# Patient Record
Sex: Female | Born: 1950 | Race: White | Hispanic: No | Marital: Married | Smoking: Never smoker
Health system: Southern US, Community
[De-identification: ages and names within clinical notes are randomized; demographics above are authoritative.]

## PROBLEM LIST (undated history)

## (undated) DIAGNOSIS — K589 Irritable bowel syndrome without diarrhea: Secondary | ICD-10-CM

## (undated) DIAGNOSIS — M1990 Unspecified osteoarthritis, unspecified site: Secondary | ICD-10-CM

## (undated) DIAGNOSIS — Z87440 Personal history of urinary (tract) infections: Secondary | ICD-10-CM

## (undated) DIAGNOSIS — E785 Hyperlipidemia, unspecified: Secondary | ICD-10-CM

## (undated) DIAGNOSIS — I10 Essential (primary) hypertension: Secondary | ICD-10-CM

## (undated) DIAGNOSIS — H269 Unspecified cataract: Secondary | ICD-10-CM

## (undated) DIAGNOSIS — K449 Diaphragmatic hernia without obstruction or gangrene: Secondary | ICD-10-CM

## (undated) DIAGNOSIS — R32 Unspecified urinary incontinence: Secondary | ICD-10-CM

## (undated) DIAGNOSIS — T7840XA Allergy, unspecified, initial encounter: Secondary | ICD-10-CM

## (undated) DIAGNOSIS — E039 Hypothyroidism, unspecified: Secondary | ICD-10-CM

## (undated) DIAGNOSIS — K219 Gastro-esophageal reflux disease without esophagitis: Secondary | ICD-10-CM

## (undated) HISTORY — DX: Diaphragmatic hernia without obstruction or gangrene: K44.9

## (undated) HISTORY — DX: Unspecified urinary incontinence: R32

## (undated) HISTORY — DX: Unspecified cataract: H26.9

## (undated) HISTORY — PX: OTHER SURGICAL HISTORY: SHX169

## (undated) HISTORY — PX: POLYPECTOMY: SHX149

## (undated) HISTORY — DX: Allergy, unspecified, initial encounter: T78.40XA

## (undated) HISTORY — PX: COLONOSCOPY: SHX174

## (undated) HISTORY — DX: Personal history of urinary (tract) infections: Z87.440

## (undated) HISTORY — DX: Irritable bowel syndrome, unspecified: K58.9

## (undated) HISTORY — DX: Unspecified osteoarthritis, unspecified site: M19.90

## (undated) HISTORY — PX: BREAST EXCISIONAL BIOPSY: SUR124

## (undated) HISTORY — DX: Essential (primary) hypertension: I10

## (undated) HISTORY — DX: Gastro-esophageal reflux disease without esophagitis: K21.9

## (undated) HISTORY — PX: WISDOM TOOTH EXTRACTION: SHX21

## (undated) HISTORY — DX: Hyperlipidemia, unspecified: E78.5

## (undated) HISTORY — PX: TONSILLECTOMY AND ADENOIDECTOMY: SHX28

## (undated) HISTORY — DX: Hypothyroidism, unspecified: E03.9

---

## 1978-05-04 HISTORY — PX: TUBAL LIGATION: SHX77

## 1988-05-04 HISTORY — PX: BREAST BIOPSY: SHX20

## 1999-03-04 ENCOUNTER — Encounter: Admission: RE | Admit: 1999-03-04 | Discharge: 1999-03-04 | Payer: Self-pay | Admitting: *Deleted

## 1999-03-04 ENCOUNTER — Encounter: Payer: Self-pay | Admitting: *Deleted

## 2001-04-06 ENCOUNTER — Other Ambulatory Visit: Admission: RE | Admit: 2001-04-06 | Discharge: 2001-04-06 | Payer: Self-pay | Admitting: Obstetrics and Gynecology

## 2001-05-04 HISTORY — PX: ABDOMINAL HYSTERECTOMY: SHX81

## 2001-05-27 ENCOUNTER — Inpatient Hospital Stay (HOSPITAL_COMMUNITY): Admission: RE | Admit: 2001-05-27 | Discharge: 2001-05-29 | Payer: Self-pay | Admitting: Obstetrics and Gynecology

## 2001-05-27 ENCOUNTER — Encounter (INDEPENDENT_AMBULATORY_CARE_PROVIDER_SITE_OTHER): Payer: Self-pay | Admitting: *Deleted

## 2002-01-23 ENCOUNTER — Encounter: Admission: RE | Admit: 2002-01-23 | Discharge: 2002-01-23 | Payer: Self-pay

## 2002-02-06 ENCOUNTER — Encounter: Admission: RE | Admit: 2002-02-06 | Discharge: 2002-02-06 | Payer: Self-pay

## 2004-01-04 ENCOUNTER — Encounter: Admission: RE | Admit: 2004-01-04 | Discharge: 2004-01-04 | Payer: Self-pay | Admitting: Family Medicine

## 2004-09-07 ENCOUNTER — Emergency Department (HOSPITAL_COMMUNITY): Admission: EM | Admit: 2004-09-07 | Discharge: 2004-09-07 | Payer: Self-pay | Admitting: Emergency Medicine

## 2004-09-09 ENCOUNTER — Ambulatory Visit: Payer: Self-pay | Admitting: Gastroenterology

## 2004-10-14 ENCOUNTER — Ambulatory Visit: Payer: Self-pay | Admitting: Gastroenterology

## 2005-02-23 ENCOUNTER — Encounter: Admission: RE | Admit: 2005-02-23 | Discharge: 2005-02-23 | Payer: Self-pay | Admitting: Obstetrics and Gynecology

## 2005-06-03 ENCOUNTER — Encounter: Admission: RE | Admit: 2005-06-03 | Discharge: 2005-06-03 | Payer: Self-pay | Admitting: Internal Medicine

## 2007-07-26 ENCOUNTER — Encounter: Admission: RE | Admit: 2007-07-26 | Discharge: 2007-07-26 | Payer: Self-pay | Admitting: Internal Medicine

## 2008-09-07 ENCOUNTER — Encounter: Admission: RE | Admit: 2008-09-07 | Discharge: 2008-09-07 | Payer: Self-pay | Admitting: Occupational Medicine

## 2010-02-13 ENCOUNTER — Encounter: Admission: RE | Admit: 2010-02-13 | Discharge: 2010-02-13 | Payer: Self-pay | Admitting: Internal Medicine

## 2010-05-04 HISTORY — PX: OTHER SURGICAL HISTORY: SHX169

## 2010-09-19 NOTE — Op Note (Signed)
[facility]  Patient:    Robin Mcdonald, Robin Mcdonald Visit Number: [id number] MRN: [id number]          Service Type: GYN Location: 4W 0456 01 Attending Physician:  Michaele Offer Dictated by:   Zenaida Niece, M.D. Proc. Date: 05/27/01 Admit Date:  05/27/2001                             Operative Report  PREOPERATIVE DIAGNOSIS:  Symptomatic leiomyomatous uterus.  POSTOPERATIVE DIAGNOSIS:  Symptomatic leiomyomatous uterus.  PROCEDURE:  Total abdominal hysterectomy with bilateral salpingo-oophorectomy.  SURGEON:  Zenaida Niece, M.D.  ASSISTANT:  Alvino Chapel, M.D.  ANESTHESIA:  Epidural.  ESTIMATED BLOOD LOSS:  100 cc.  FINDINGS:  Enlarged irregular uterus with multiple leiomyomas, normal tubes and ovaries.  DESCRIPTION OF PROCEDURE:  The patient was taken to the operating room after having her epidural placed in the PACU. She was placed in the dorsal supine position and her epidural was dosed appropriately. Her abdomen and vagina were prepped and draped in the usual sterile fashion and a Foley catheter inserted. The level of her anesthesia was found to be adequate and her abdomen was entered via a standard Pfannenstiel incision. A self retaining retractor was placed and the bowels were packed out of the pelvis. The uterus was inspected and found to be enlarged with multiple small leiomyomas. Both uterine cornu were grasped with Kelly clamps. The round ligaments were divided with electrocautery and the broad ligaments divided laterally and anteriorly to come across the anterior portion of the uterus and create the bladder flap. Both infundibulopelvic ligaments were isolated, clamped, transected, and doubly ligated with #1 chromic. The uterine arteries were then skeletonized after the bladder was pushed inferiorly. The uterine arteries were clamped, transected and ligated with #1 chromic. The cardinal ligaments and  uterosacral ligaments were clamped, transected, and ligated with #1 chromic being sure to push the bladder inferiorly out of the way. Right angled Zeppelins were then used to come across both vaginal angles and the uterus and cervix with the ovaries and tubes attached were removed sharply. The vaginal angles were sutured with #1 chromic and tagged for later use. The remainder of the vagina was closed with one figure-of-eight suture of #1 chromic. The pelvis was irrigated and bleeding from serosal edges was controlled with electrocautery. The bladder did appear to be fairly close to the vaginal incision. The bladder was then filled with sterile milk and on palpation and inspection there was no milk spillage and the bladder did appear to be well away from any suture lines. The pelvis was again irrigated and found to be hemostatic. The uterosacral ligaments were plicated in the midline with #0 silk. The pelvis was again inspected and found to be hemostatic. All packs were removed and a self retaining retractor was removed. The ureters had been identified on both sides and were found to be well away from any suture lines. The subfascial space was irrigated and made hemostatic with electrocautery. The fascia was closed in a running fashion starting at both ends and meeting in the middle with #0 Vicryl. The subcutaneous tissue was irrigated and made hemostatic with electrocautery. It was then closed with running 2-0 plain gut suture. The skin was then closed with staples and a sterile dressing. The patient tolerated the procedure well and was taken to the operating room in stable condition. Dictated by:   Zenaida Niece, M.D. Attending  Physician:  Michaele Offer DD:  05/27/01 TD:  05/28/01 Job: [phone] [id number]

## 2010-09-19 NOTE — H&P (Signed)
[facility]  Patient:    Robin Mcdonald, Robin Mcdonald Visit Number: [id number] MRN: [id number]          Service Type: Attending:  Zenaida Niece, M.D. Dictated by:   Zenaida Niece, M.D. Adm. Date:  05/27/01                           History and Physical  CHIEF COMPLAINT:  Leiomyomatas uterus.  HISTORY OF PRESENT ILLNESS:  This is a 60 year old white female G2, P1-0-1-1 who was seen for an annual exam on April 06, 2001.  At that time she complained of having menses every month with two months of spotting prior to menses.  Her menses were heavy off and on with significant clotting and significant cramping.  She was also experiencing urinary frequency and leakage with cough and sneeze.  Exam revealed 10 to 12-week-size irregular uterus consistent with leiomyomatas.  This was significantly enlarged from the exam a year prior and the patient was advised to have hysterectomy.  She is admitted for this at this time.  PAST OB HISTORY:  In 1971 elected termination, in 1975 vaginal delivery at term 7 pounds 14 ounces and she had post partum febrile morbidity.  PAST MEDICAL HISTORY:  Hypertension, irritable bowel, gastroesophageal reflux, and asthma.  PAST SURGICAL HISTORY:  Tubal ligation, tonsillectomy, wisdom teeth removal, left ear stapedectomy, left breast biopsy, right ear stapedectomy, ganglion cyst removal.  CURRENT MEDICATIONS:  Hyzaar, Nexium, Advair, Robinul Forte, and albuterol p.r.n.  SOCIAL HISTORY:  The patient is married and denies alcohol, tobacco or drug use.  REVIEW OF SYSTEMS:  Other than urine leakage she has a normal urinary function and normal bowel function and no other significant complaints.  FAMILY HISTORY:  No GYN or colon cancer.  PHYSICAL EXAMINATION:  GENERAL:  Weight is 150 pounds.  She is a well-developed, well-nourished white female who is in no acute distress.  VITAL SIGNS:  Blood pressure 142/80, pulse 100, office  hemoglobin 15.4.  HEENT:  Pupils are equally round and reactive to light and accommodation. Extraocular muscles are intact.  Oropharynx is clear without erythema or exudates.  NECK:  Supple without lymphadenopathy or thyromegaly.  LUNGS:  Clear to auscultation.  HEART:  Regular rate and rhythm without murmur.  BREASTS:  Exam in the sitting and supine position revealed no dominant masses, adenopathy, skin change, or nipple discharge and she has a scar on her left breast from prior biopsy.  ABDOMEN:  Soft, nontender, nondistended with palpable uterine fundus or myoma just above the pubic symphysis.  PELVIC EXAM :  Reveals normal external genitalia.  On speculum exam she has a normal cervix and Pap smear has returned within normal limits.  Bimanual exam reveals a 10 to 12-week-size irregular uterus with palpable myomas.  She does a good Kegel exercise and has no appreciable prolapse.  Recto-vaginal exam confirms this.  ASSESSMENT:  Symptomatic leiomyomatas uterus.  The patient has been given options of expectant management, treatment with Lupron and surgical therapy. I have had an extensive discussion with the patient and her husband and they agree to proceed with hysterectomy.  Risks for surgery including bleeding, infection, damage to surrounding organs have been discussed and the patient agrees to proceed.  Preop lab work has revealed hypokalemia which is being replaced orally.  Her other multiple medical problems appear to be in good control.  PLAN:  The plan is to admit the patient for total abdominal hysterectomy with  bilateral salpingo-oophorectomy. Dictated by:   Zenaida Niece, M.D. Attending:  Zenaida Niece, M.D. DD:  05/26/01 TD:  05/26/01 Job: [id number]

## 2010-09-19 NOTE — Discharge Summary (Signed)
[facility]  Patient:    Robin Mcdonald, Robin Mcdonald Visit Number: [id number] MRN: [id number]          Service Type: GYN Location: 4W 0456 01 Attending Physician:  Michaele Offer Dictated by:   Zenaida Niece, M.D. Admit Date:  05/27/2001 Discharge Date: 05/29/2001                             Discharge Summary  ADMISSION DIAGNOSIS:  Symptomatic leiomyomatous uterus.  DISCHARGE DIAGNOSIS:  Symptomatic leiomyomatous uterus.  PROCEDURE:  Total abdominal hysterectomy, bilateral salpingo-oophorectomy.  COMPLICATIONS:  None.  CONSULTATIONS:  None.  HISTORY OF PRESENT ILLNESS:  For full history and physical, please see the chart.  Briefly, this is a 60 year old white female, gravida 2, para 1-0-1-1, with an enlarging leiomyomatous uterus who has heavy menses with significant clotting and significant cramping, and is experiencing some pressure on her bladder from the fibroids, and is admitted for surgical removal.  PHYSICAL EXAMINATION:  ABDOMEN:  Benign with the uterine fundus palpable just above the pubic synthesis.  PELVIC:  Uterus is 10 to 12 weeks size, irregular, with palpable myomas, and she has no appreciable prolapse, and no adnexal masses.  HOSPITAL COURSE:  The patient was admitted on the day of surgery and underwent a total abdominal hysterectomy and bilateral salpingo-oophorectomy under epidural anesthesia.  Estimated blood loss was 100 cc.  She had an enlarged irregular uterus with multiple leiomyomas and normal tubes and ovaries.  Postoperatively, she was continued on her epidural and did have some leg numbness due to continued use of local anesthetic.  Once this was discontinued and narcotic only was continued, the leg numbness went away and she felt much better.  She did have a temperature to 100.8, on postoperative day #0, but that was her only temperature, and she was otherwise afebrile.  Preoperative hemoglobin was 13.6,  postoperative was 12.2. Preoperative potassium was 3.2, up from 3.1 a week preoperatively with potassium supplementation.  She was continued on all of her preadmission medications for her other medical problems, which include hypertension, irritable bowel, gastroesophageal reflux, and asthma.  On the morning of postoperative day #2, she again had remained afebrile, she was tolerating a regular diet without nausea and vomiting, and was passing flatus.  Her incision was well approximated, and she was felt to be stable enough for discharge home.  CONDITION ON DISCHARGE:  Stable.  DISPOSITION:  Discharged to home.  DIET:  Regular.  ACTIVITY:  Pelvic rest.  No strenuous activity, no driving.  FOLLOWUP:  In three days for staple removal.  DISCHARGE MEDICATIONS: 1. She is to continue all of her preadmission medications including potassium    20 mEq b.i.d. 2. Percocet one or two p.o. q.4-6h. p.r.n. pain #30. 3. Estradiol 1 mg p.o. q.d. #60.Dictated by:   Zenaida Niece, M.D.  Attending Physician:  Michaele Offer DD:  05/29/01 TD:  05/30/01 Job: [id number]

## 2011-01-26 ENCOUNTER — Encounter: Payer: Self-pay | Admitting: Gastroenterology

## 2011-02-02 ENCOUNTER — Ambulatory Visit (INDEPENDENT_AMBULATORY_CARE_PROVIDER_SITE_OTHER): Payer: BC Managed Care – PPO | Admitting: Gastroenterology

## 2011-02-02 ENCOUNTER — Other Ambulatory Visit (INDEPENDENT_AMBULATORY_CARE_PROVIDER_SITE_OTHER): Payer: BC Managed Care – PPO

## 2011-02-02 ENCOUNTER — Encounter: Payer: Self-pay | Admitting: Gastroenterology

## 2011-02-02 VITALS — BP 124/78 | HR 80 | Ht 64.5 in | Wt 145.0 lb

## 2011-02-02 DIAGNOSIS — K219 Gastro-esophageal reflux disease without esophagitis: Secondary | ICD-10-CM

## 2011-02-02 DIAGNOSIS — R1013 Epigastric pain: Secondary | ICD-10-CM

## 2011-02-02 DIAGNOSIS — K589 Irritable bowel syndrome without diarrhea: Secondary | ICD-10-CM

## 2011-02-02 LAB — BASIC METABOLIC PANEL
BUN: 21 mg/dL (ref 6–23)
CO2: 31 mEq/L (ref 19–32)
Calcium: 9.5 mg/dL (ref 8.4–10.5)
Chloride: 100 mEq/L (ref 96–112)
Creatinine, Ser: 0.9 mg/dL (ref 0.4–1.2)
GFR: 69.87 mL/min (ref >60.00)
Glucose, Bld: 99 mg/dL (ref 70–99)
Potassium: 3.5 mEq/L (ref 3.5–5.1)
Sodium: 141 mEq/L (ref 135–145)

## 2011-02-02 LAB — HEPATIC FUNCTION PANEL
ALT: 21 U/L (ref 0–35)
AST: 25 U/L (ref 0–37)
Albumin: 4.9 g/dL (ref 3.5–5.2)
Alkaline Phosphatase: 55 U/L (ref 39–117)
Bilirubin, Direct: 0.1 mg/dL (ref 0.0–0.3)
Total Bilirubin: 0.5 mg/dL (ref 0.3–1.2)
Total Protein: 7.9 g/dL (ref 6.0–8.3)

## 2011-02-02 LAB — TSH: TSH: 0.88 u[IU]/mL (ref 0.35–5.50)

## 2011-02-02 LAB — CBC WITH DIFFERENTIAL/PLATELET
Basophils Absolute: 0 10*3/uL (ref 0.0–0.1)
Eosinophils Absolute: 0.1 10*3/uL (ref 0.0–0.7)
Hemoglobin: 14.4 g/dL (ref 12.0–15.0)
Monocytes Relative: 6.4 % (ref 3.0–12.0)
RBC: 4.9 Mil/uL (ref 3.87–5.11)
RDW: 13.3 % (ref 11.5–14.6)

## 2011-02-02 LAB — AMYLASE: Amylase: 40 U/L (ref 27–131)

## 2011-02-02 LAB — LIPASE: Lipase: 27 U/L (ref 11.0–59.0)

## 2011-02-02 NOTE — Patient Instructions (Signed)
Go directly to the basement to have your labs today.  You have been scheduled for a Upper Endoscopy. See separate instructions.  Your Abdominal Ultrasound has been scheduled for 02/05/11 at 8:00am at [facility]. Please arrive at 7:45 for registration. Nothing to eat or drink after midnight. cc: Georgann Housekeeper, MD

## 2011-02-02 NOTE — Progress Notes (Signed)
History of Present Illness: This is a 60 year old female seen in the past. She has a history of GERD and irritable bowel syndrome with alternating diarrhea and constipation. She underwent upper endoscopy in September 2002 showing a small hiatal hernia. She underwent colonoscopy in June 2006 for hematochezia and Hemoccult-positive stool showing only small internal. She has been maintained on esomeprazole and glycopyrrolate for years to manage GERD and IBS. She states over the past 10 months she has had recurrent episodes of epigastric pain, generally occurring between 2 and 3 in the morning, and lasting for about 1-2 hours. It generally occurs once or twice each month. She describes a dull, aching pain associated with nausea. She does not related her symptoms to any particular foods or activities. On one occasion this occurred in the afternoon. Denies weight loss, change in stool caliber, melena, hematochezia, nausea, vomiting, dysphagia, reflux symptoms, chest pain.  Review of Systems: Pertinent positive and negative review of systems were noted in the above HPI section. All other review of systems were otherwise negative.  Current Medications, Allergies, Past Medical History, Past Surgical History, Family History and Social History were reviewed in [employer] record.  Physical Exam: General: Well developed , well nourished, no acute distress Head: Normocephalic and atraumatic Eyes:  sclerae anicteric, EOMI Ears: Normal auditory acuity Mouth: No deformity or lesions Neck: Supple, no masses or thyromegaly Lungs: Clear throughout to auscultation Heart: Regular rate and rhythm; no murmurs, rubs or bruits Abdomen: Soft, non tender and non distended. No masses, hepatosplenomegaly or hernias noted. Normal Bowel sounds Musculoskeletal: Symmetrical with no gross deformities  Skin: No lesions on visible extremities Pulses:  Normal pulses noted Extremities: No clubbing, cyanosis,  edema or deformities noted Neurological: Alert oriented x 4, grossly nonfocal Cervical Nodes:  No significant cervical adenopathy Inguinal Nodes: No significant inguinal adenopathy Psychological:  Alert and cooperative. Normal mood and affect  Assessment and Recommendations:  1. Recurrent epigastric pain generally occurring at night. Rule out GERD, ulcer disease and cholelithiasis. Obtain blood work. Schedule ultrasound. Schedule upper endoscopy. The risks, benefits, and alternatives to endoscopy with possible biopsy and possible dilation were discussed with the patient and they consent to proceed.   2. Colorectal cancer screening. Average risk. Colonoscopy June 2016.

## 2011-02-03 ENCOUNTER — Encounter: Payer: Self-pay | Admitting: Gastroenterology

## 2011-02-05 ENCOUNTER — Ambulatory Visit (HOSPITAL_COMMUNITY)
Admission: RE | Admit: 2011-02-05 | Discharge: 2011-02-05 | Disposition: A | Discharge status: Home / Self Care | Payer: BC Managed Care – PPO | Source: Ambulatory Visit | Attending: Gastroenterology | Admitting: Gastroenterology

## 2011-02-05 DIAGNOSIS — R1013 Epigastric pain: Secondary | ICD-10-CM | POA: Insufficient documentation

## 2011-02-05 DIAGNOSIS — K8020 Calculus of gallbladder without cholecystitis without obstruction: Secondary | ICD-10-CM | POA: Insufficient documentation

## 2011-02-11 ENCOUNTER — Other Ambulatory Visit: Payer: BC Managed Care – PPO | Admitting: Gastroenterology

## 2011-03-05 ENCOUNTER — Encounter: Payer: Self-pay | Admitting: Gastroenterology

## 2011-03-05 ENCOUNTER — Ambulatory Visit (AMBULATORY_SURGERY_CENTER): Payer: BC Managed Care – PPO | Admitting: Gastroenterology

## 2011-03-05 DIAGNOSIS — K219 Gastro-esophageal reflux disease without esophagitis: Secondary | ICD-10-CM

## 2011-03-05 DIAGNOSIS — R1013 Epigastric pain: Secondary | ICD-10-CM

## 2011-03-05 DIAGNOSIS — K589 Irritable bowel syndrome without diarrhea: Secondary | ICD-10-CM

## 2011-03-05 MED ORDER — SODIUM CHLORIDE 0.9 % IV SOLN: 500.0000 mL | INTRAVENOUS | Status: DC

## 2011-03-05 NOTE — Patient Instructions (Signed)
Please refer to blue and green discharge instruction sheets. 

## 2011-03-06 ENCOUNTER — Telehealth: Payer: Self-pay | Admitting: *Deleted

## 2011-03-06 NOTE — Telephone Encounter (Signed)
Called and got voice mail that identifies pt by first and last name so left message for pt. ewm

## 2011-09-01 ENCOUNTER — Other Ambulatory Visit: Payer: Self-pay | Admitting: Internal Medicine

## 2011-09-01 DIAGNOSIS — Z1231 Encounter for screening mammogram for malignant neoplasm of breast: Secondary | ICD-10-CM

## 2011-09-21 ENCOUNTER — Ambulatory Visit
Admission: RE | Admit: 2011-09-21 | Discharge: 2011-09-21 | Disposition: A | Discharge status: Home / Self Care | Payer: BC Managed Care – PPO | Source: Ambulatory Visit | Attending: Internal Medicine | Admitting: Internal Medicine

## 2011-09-21 DIAGNOSIS — Z1231 Encounter for screening mammogram for malignant neoplasm of breast: Secondary | ICD-10-CM

## 2011-10-12 ENCOUNTER — Encounter: Payer: BC Managed Care – PPO | Admitting: Obstetrics and Gynecology

## 2011-10-12 ENCOUNTER — Ambulatory Visit (INDEPENDENT_AMBULATORY_CARE_PROVIDER_SITE_OTHER): Payer: BC Managed Care – PPO | Admitting: Obstetrics and Gynecology

## 2011-10-12 ENCOUNTER — Encounter: Payer: Self-pay | Admitting: Obstetrics and Gynecology

## 2011-10-12 VITALS — BP 120/60 | Ht 65.0 in | Wt 141.0 lb

## 2011-10-12 DIAGNOSIS — Z124 Encounter for screening for malignant neoplasm of cervix: Secondary | ICD-10-CM

## 2011-10-12 DIAGNOSIS — Z01419 Encounter for gynecological examination (general) (routine) without abnormal findings: Secondary | ICD-10-CM

## 2011-10-12 NOTE — Progress Notes (Signed)
Addended by: Marla Roe A on: 10/12/2011 02:54 PM   Modules accepted: Orders

## 2011-10-12 NOTE — Progress Notes (Signed)
Last Pap:2003 WNL: Yes Regular Periods:no Contraception: None  Monthly Breast exam:yes Tetanus<6yrs:yes Nl.Bladder Function:yes Frequency Daily BMs:no Healthy Diet:yes Calcium:yes Mammogram:[facility]  [facility] Exercise:yes walks dog Seatbelt: yes Abuse at home: no Stressful work:yes Sigmoid-colonoscopy: 2003    Normal Dr Joellen Jersey Density: Yes 2012 Dr Eula Listen  No complaints except constipation followed by Dr. Virgina Evener Vitals:   10/12/11 1348  BP: 120/60   ROS: noncontributory  Physical Examination: General appearance - alert, well appearing, and in no distress Neck - supple, no significant adenopathy Chest - clear to auscultation, no wheezes, rales or rhonchi, symmetric air entry Heart - normal rate and regular rhythm Abdomen - soft, nontender, nondistended, no masses or organomegaly Breasts - breasts appear normal, no suspicious masses, no skin or nipple changes or axillary nodes Pelvic -s/p hysterectomy and BSO, no palpable masses Back exam - no CVAT Extremities - no edema, redness or tenderness in the calves or thighs Rectal no masses, + a lot of palpable stool  A/P Normal AEX Cont to f/u with GI for constipation issues - recs discussed Pap today (will be her last, pt had hysterectomy for benign reasons)

## 2011-10-13 LAB — PAP IG W/ RFLX HPV ASCU

## 2012-04-05 ENCOUNTER — Other Ambulatory Visit (HOSPITAL_COMMUNITY): Payer: Self-pay | Admitting: Orthopedic Surgery

## 2012-04-05 DIAGNOSIS — R0989 Other specified symptoms and signs involving the circulatory and respiratory systems: Secondary | ICD-10-CM

## 2012-04-05 DIAGNOSIS — M79609 Pain in unspecified limb: Secondary | ICD-10-CM

## 2012-04-22 ENCOUNTER — Ambulatory Visit (HOSPITAL_COMMUNITY)
Admission: RE | Admit: 2012-04-22 | Discharge: 2012-04-22 | Disposition: A | Discharge status: Home / Self Care | Payer: BC Managed Care – PPO | Source: Ambulatory Visit | Attending: Orthopedic Surgery | Admitting: Orthopedic Surgery

## 2012-04-22 DIAGNOSIS — R0989 Other specified symptoms and signs involving the circulatory and respiratory systems: Secondary | ICD-10-CM | POA: Insufficient documentation

## 2012-04-22 DIAGNOSIS — M79609 Pain in unspecified limb: Secondary | ICD-10-CM | POA: Insufficient documentation

## 2012-04-22 NOTE — Progress Notes (Signed)
Lower extremity arterial duplex completed. Tina Temme RVT 

## 2012-04-25 NOTE — Progress Notes (Signed)
BLE Arterial Duplex done but charged for unilateral only per Dr. Allyson Sabal.  Almira Coaster

## 2012-05-17 ENCOUNTER — Other Ambulatory Visit: Payer: Self-pay | Admitting: Orthopedic Surgery

## 2012-05-17 DIAGNOSIS — M79605 Pain in left leg: Secondary | ICD-10-CM

## 2012-05-17 DIAGNOSIS — M549 Dorsalgia, unspecified: Secondary | ICD-10-CM

## 2012-06-10 ENCOUNTER — Ambulatory Visit
Admission: RE | Admit: 2012-06-10 | Discharge: 2012-06-10 | Disposition: A | Discharge status: Home / Self Care | Payer: BC Managed Care – PPO | Source: Ambulatory Visit | Attending: Orthopedic Surgery | Admitting: Orthopedic Surgery

## 2012-06-10 VITALS — BP 109/68 | HR 77

## 2012-06-10 DIAGNOSIS — M549 Dorsalgia, unspecified: Secondary | ICD-10-CM

## 2012-06-10 DIAGNOSIS — M79605 Pain in left leg: Secondary | ICD-10-CM

## 2012-06-10 MED ORDER — ONDANSETRON HCL 4 MG/2ML IJ SOLN: 4.0000 mg | Freq: Four times a day (QID) | INTRAMUSCULAR | Status: DC | PRN

## 2012-06-10 MED ORDER — IOHEXOL 180 MG/ML  SOLN
15.0000 mL | Freq: Once | INTRAMUSCULAR | Status: AC | PRN
  Administered 2012-06-10: 15 mL via INTRATHECAL

## 2012-06-10 MED ORDER — DIAZEPAM 5 MG PO TABS
10.0000 mg | ORAL_TABLET | Freq: Once | ORAL | Status: AC
  Administered 2012-06-10: 10 mg via ORAL

## 2012-09-23 ENCOUNTER — Encounter: Payer: Self-pay | Admitting: Gastroenterology

## 2014-01-05 ENCOUNTER — Other Ambulatory Visit: Payer: Self-pay

## 2014-01-05 DIAGNOSIS — Z1231 Encounter for screening mammogram for malignant neoplasm of breast: Secondary | ICD-10-CM

## 2014-01-23 ENCOUNTER — Ambulatory Visit: Payer: BC Managed Care – PPO

## 2014-02-06 ENCOUNTER — Encounter (INDEPENDENT_AMBULATORY_CARE_PROVIDER_SITE_OTHER): Payer: Self-pay

## 2014-02-06 ENCOUNTER — Ambulatory Visit
Admission: RE | Admit: 2014-02-06 | Discharge: 2014-02-06 | Disposition: A | Discharge status: Home / Self Care | Payer: BC Managed Care – PPO | Source: Ambulatory Visit

## 2014-02-06 DIAGNOSIS — Z1231 Encounter for screening mammogram for malignant neoplasm of breast: Secondary | ICD-10-CM

## 2014-03-05 ENCOUNTER — Encounter: Payer: Self-pay | Admitting: Obstetrics and Gynecology

## 2014-09-07 ENCOUNTER — Encounter: Payer: Self-pay | Admitting: Gastroenterology

## 2015-06-06 ENCOUNTER — Other Ambulatory Visit: Payer: Self-pay

## 2015-06-06 DIAGNOSIS — Z1231 Encounter for screening mammogram for malignant neoplasm of breast: Secondary | ICD-10-CM

## 2015-06-17 ENCOUNTER — Ambulatory Visit
Admission: RE | Admit: 2015-06-17 | Discharge: 2015-06-17 | Disposition: A | Discharge status: Home / Self Care | Payer: BLUE CROSS/BLUE SHIELD | Source: Ambulatory Visit

## 2015-06-17 DIAGNOSIS — Z1231 Encounter for screening mammogram for malignant neoplasm of breast: Secondary | ICD-10-CM

## 2015-08-14 ENCOUNTER — Encounter: Payer: Self-pay | Admitting: Gastroenterology

## 2015-10-18 ENCOUNTER — Ambulatory Visit (AMBULATORY_SURGERY_CENTER): Payer: Self-pay | Admitting: *Deleted

## 2015-10-18 VITALS — Ht 64.0 in | Wt 138.2 lb

## 2015-10-18 DIAGNOSIS — Z1211 Encounter for screening for malignant neoplasm of colon: Secondary | ICD-10-CM

## 2015-10-18 MED ORDER — NA SULFATE-K SULFATE-MG SULF 17.5-3.13-1.6 GM/177ML PO SOLN: 1.0000 | Freq: Once | ORAL | Status: DC

## 2015-10-18 NOTE — Progress Notes (Signed)
Denies allergies to eggs or soy products. Denies complications with sedation or anesthesia. Denies O2 use. Denies use of diet or weight loss medications.  Emmi instructions given for colonoscopy.  

## 2015-10-23 ENCOUNTER — Encounter: Payer: Self-pay | Admitting: Gastroenterology

## 2015-11-01 ENCOUNTER — Encounter: Payer: Self-pay | Admitting: Gastroenterology

## 2015-11-01 ENCOUNTER — Ambulatory Visit (AMBULATORY_SURGERY_CENTER): Payer: BLUE CROSS/BLUE SHIELD | Admitting: Gastroenterology

## 2015-11-01 VITALS — BP 97/62 | HR 71 | Temp 97.5°F | Resp 12 | Ht 64.0 in | Wt 138.0 lb

## 2015-11-01 DIAGNOSIS — D123 Benign neoplasm of transverse colon: Secondary | ICD-10-CM | POA: Diagnosis not present

## 2015-11-01 DIAGNOSIS — D124 Benign neoplasm of descending colon: Secondary | ICD-10-CM

## 2015-11-01 DIAGNOSIS — Z1211 Encounter for screening for malignant neoplasm of colon: Secondary | ICD-10-CM | POA: Diagnosis not present

## 2015-11-01 MED ORDER — SODIUM CHLORIDE 0.9 % IV SOLN: 500.0000 mL | INTRAVENOUS | Status: DC

## 2015-11-01 NOTE — Op Note (Signed)
[facility] Patient Name: Robin Mcdonald Procedure Date: 11/01/2015 8:28 AM MRN: [id number] Endoscopist: Ladene Artist , MD Age: 65 Referring MD:  Date of Birth: 19-Nov-1950 Gender: Female Account #: [id number] Procedure:                Colonoscopy Indications:              Screening for colorectal malignant neoplasm Medicines:                Monitored Anesthesia Care Procedure:                Pre-Anesthesia Assessment:                           - Prior to the procedure, a History and Physical                            was performed, and patient medications and                            allergies were reviewed. The patient's tolerance of                            previous anesthesia was also reviewed. The risks                            and benefits of the procedure and the sedation                            options and risks were discussed with the patient.                            All questions were answered, and informed consent                            was obtained. Prior Anticoagulants: The patient has                            taken no previous anticoagulant or antiplatelet                            agents. ASA Grade Assessment: II - A patient with                            mild systemic disease. After reviewing the risks                            and benefits, the patient was deemed in                            satisfactory condition to undergo the procedure.                           After obtaining informed consent, the colonoscope  was passed under direct vision. Throughout the                            procedure, the patient's blood pressure, pulse, and                            oxygen saturations were monitored continuously. The                            Model PCF-H190L [id number]) scope was introduced                            through the anus and advanced to the the cecum,                            identified by  appendiceal orifice and ileocecal                            valve. The ileocecal valve, appendiceal orifice,                            and rectum were photographed. The quality of the                            bowel preparation was good. The colonoscopy was                            performed without difficulty. The patient tolerated                            the procedure well. Scope In: 8:38:34 AM Scope Out: 9:01:41 AM Scope Withdrawal Time: 0 hours 17 minutes 2 seconds  Total Procedure Duration: 0 hours 23 minutes 7 seconds  Findings:                 A 10 mm polyp was found in the descending colon.                            The polyp was sessile. The polyp was removed with a                            hot snare. The polyp was removed with a cold snare.                            Resection and retrieval were complete.                           Six sessile polyps were found in the transverse                            colon. The polyps were 4 to 5 mm in size. These  polyps were removed with a cold biopsy forceps.                            Resection and retrieval were complete.                           The exam was otherwise without abnormality on                            direct and retroflexion views.                           Three sessile polyps were found in the descending                            colon (1) and transverse colon (2). The polyps were                            6 to 7 mm in size. These polyps were removed with a                            cold snare. Resection and retrieval were complete.                           Internal hemorrhoids were found during                            retroflexion. The hemorrhoids were small and Grade                            I (internal hemorrhoids that do not prolapse). Complications:            No immediate complications. Estimated blood loss:                            None. Estimated Blood Loss:      Estimated blood loss: none. Impression:               - One 10 mm polyp in the descending colon, removed                            with a cold snare and removed with a hot snare.                            Resected and retrieved.                           - Six 4 to 5 mm polyps in the transverse colon,                            removed with a cold biopsy forceps. Resected and                            retrieved.                           -  Three 6 to 7 mm polyps in the descending colon                            and in the transverse colon, removed with a cold                            snare. Resected and retrieved.                           - Internal hemorrhoids. Recommendation:           - Repeat colonoscopy in 3 years for surveillance                            pending pathology review.                           - Patient has a contact number available for                            emergencies. The signs and symptoms of potential                            delayed complications were discussed with the                            patient. Return to normal activities tomorrow.                            Written discharge instructions were provided to the                            patient.                           - Resume previous diet.                           - Continue present medications.                           - Await pathology results. Ladene Artist, MD 11/01/2015 9:08:58 AM This report has been signed electronically.

## 2015-11-01 NOTE — Progress Notes (Signed)
Called to room to assist during endoscopic procedure.  Patient ID and intended procedure confirmed with present staff. Received instructions for my participation in the procedure from the performing physician.  

## 2015-11-01 NOTE — Progress Notes (Signed)
A and O x3. Report to RN. Tolerated MAC anesthesia well. 

## 2015-11-01 NOTE — Patient Instructions (Signed)
YOU HAD AN ENDOSCOPIC PROCEDURE TODAY AT THE [facility]:   Refer to the procedure report that was given to you for any specific questions about what was found during the examination.  If the procedure report does not answer your questions, please call your gastroenterologist to clarify.  If you requested that your care partner not be given the details of your procedure findings, then the procedure report has been included in a sealed envelope for you to review at your convenience later.  YOU SHOULD EXPECT: Some feelings of bloating in the abdomen. Passage of more gas than usual.  Walking can help get rid of the air that was put into your GI tract during the procedure and reduce the bloating. If you had a lower endoscopy (such as a colonoscopy or flexible sigmoidoscopy) you may notice spotting of blood in your stool or on the toilet paper. If you underwent a bowel prep for your procedure, you may not have a normal bowel movement for a few days.  Please Note:  You might notice some irritation and congestion in your nose or some drainage.  This is from the oxygen used during your procedure.  There is no need for concern and it should clear up in a day or so.  SYMPTOMS TO REPORT IMMEDIATELY:   Following lower endoscopy (colonoscopy or flexible sigmoidoscopy):  Excessive amounts of blood in the stool  Significant tenderness or worsening of abdominal pains  Swelling of the abdomen that is new, acute  Fever of 100F or higher    For urgent or emergent issues, a gastroenterologist can be reached at any hour by calling [phone].   DIET: Your first meal following the procedure should be a small meal and then it is ok to progress to your normal diet. Heavy or fried foods are harder to digest and may make you feel nauseous or bloated.  Likewise, meals heavy in dairy and vegetables can increase bloating.  Drink plenty of fluids but you should avoid alcoholic beverages for 24  hours.  ACTIVITY:  You should plan to take it easy for the rest of today and you should NOT DRIVE or use heavy machinery until tomorrow (because of the sedation medicines used during the test).    FOLLOW UP: Our staff will call the number listed on your records the next business day following your procedure to check on you and address any questions or concerns that you may have regarding the information given to you following your procedure. If we do not reach you, we will leave a message.  However, if you are feeling well and you are not experiencing any problems, there is no need to return our call.  We will assume that you have returned to your regular daily activities without incident.  If any biopsies were taken you will be contacted by phone or by letter within the next 1-3 weeks.  Please call us at [phone] if you have not heard about the biopsies in 3 weeks.    SIGNATURES/CONFIDENTIALITY: You and/or your care partner have signed paperwork which will be entered into your electronic medical record.  These signatures attest to the fact that that the information above on your After Visit Summary has been reviewed and is understood.  Full responsibility of the confidentiality of this discharge information lies with you and/or your care-partner   Information on polyps and hemorrhoids given to you today        

## 2015-11-04 ENCOUNTER — Telehealth: Payer: Self-pay | Admitting: *Deleted

## 2015-11-04 NOTE — Telephone Encounter (Signed)
  Follow up Call-  Call back number 11/01/2015  Post procedure Call Back phone  # [phone]  Permission to leave phone message Yes     Patient questions:  Do you have a fever, pain , or abdominal swelling? No. Pain Score  0 *  Have you tolerated food without any problems? Yes.    Have you been able to return to your normal activities? Yes.    Do you have any questions about your discharge instructions: Diet   No. Medications  No. Follow up visit  No.  Do you have questions or concerns about your Care? No.  Actions: * If pain score is 4 or above: No action needed, pain <4.

## 2015-11-10 ENCOUNTER — Encounter: Payer: Self-pay | Admitting: Gastroenterology

## 2016-07-14 ENCOUNTER — Other Ambulatory Visit: Payer: Self-pay | Admitting: Internal Medicine

## 2016-07-14 DIAGNOSIS — R310 Gross hematuria: Secondary | ICD-10-CM

## 2016-07-14 DIAGNOSIS — R109 Unspecified abdominal pain: Secondary | ICD-10-CM

## 2016-07-17 ENCOUNTER — Ambulatory Visit
Admission: RE | Admit: 2016-07-17 | Discharge: 2016-07-17 | Disposition: A | Discharge status: Home / Self Care | Payer: BLUE CROSS/BLUE SHIELD | Source: Ambulatory Visit | Attending: Internal Medicine | Admitting: Internal Medicine

## 2016-07-17 DIAGNOSIS — R109 Unspecified abdominal pain: Secondary | ICD-10-CM

## 2016-07-17 DIAGNOSIS — R310 Gross hematuria: Secondary | ICD-10-CM

## 2017-03-03 ENCOUNTER — Other Ambulatory Visit: Payer: Self-pay | Admitting: Internal Medicine

## 2017-03-03 DIAGNOSIS — Z1231 Encounter for screening mammogram for malignant neoplasm of breast: Secondary | ICD-10-CM

## 2017-04-08 ENCOUNTER — Ambulatory Visit
Admission: RE | Admit: 2017-04-08 | Discharge: 2017-04-08 | Disposition: A | Discharge status: Home / Self Care | Payer: BLUE CROSS/BLUE SHIELD | Source: Ambulatory Visit | Attending: Internal Medicine | Admitting: Internal Medicine

## 2017-04-08 DIAGNOSIS — Z1231 Encounter for screening mammogram for malignant neoplasm of breast: Secondary | ICD-10-CM | POA: Diagnosis not present

## 2017-05-27 DIAGNOSIS — D3132 Benign neoplasm of left choroid: Secondary | ICD-10-CM | POA: Diagnosis not present

## 2017-06-27 DIAGNOSIS — L03011 Cellulitis of right finger: Secondary | ICD-10-CM | POA: Diagnosis not present

## 2017-07-29 DIAGNOSIS — Z79899 Other long term (current) drug therapy: Secondary | ICD-10-CM | POA: Diagnosis not present

## 2017-07-29 DIAGNOSIS — E559 Vitamin D deficiency, unspecified: Secondary | ICD-10-CM | POA: Diagnosis not present

## 2017-07-29 DIAGNOSIS — Z23 Encounter for immunization: Secondary | ICD-10-CM | POA: Diagnosis not present

## 2017-07-29 DIAGNOSIS — J45909 Unspecified asthma, uncomplicated: Secondary | ICD-10-CM | POA: Diagnosis not present

## 2017-07-29 DIAGNOSIS — E782 Mixed hyperlipidemia: Secondary | ICD-10-CM | POA: Diagnosis not present

## 2017-07-29 DIAGNOSIS — Z0000 Encounter for general adult medical examination without abnormal findings: Secondary | ICD-10-CM | POA: Diagnosis not present

## 2017-07-29 DIAGNOSIS — I10 Essential (primary) hypertension: Secondary | ICD-10-CM | POA: Diagnosis not present

## 2017-09-08 DIAGNOSIS — R310 Gross hematuria: Secondary | ICD-10-CM | POA: Diagnosis not present

## 2017-09-08 DIAGNOSIS — R350 Frequency of micturition: Secondary | ICD-10-CM | POA: Diagnosis not present

## 2017-09-09 ENCOUNTER — Other Ambulatory Visit: Payer: Self-pay | Admitting: Internal Medicine

## 2017-09-09 ENCOUNTER — Ambulatory Visit
Admission: RE | Admit: 2017-09-09 | Discharge: 2017-09-09 | Disposition: A | Discharge status: Home / Self Care | Payer: BLUE CROSS/BLUE SHIELD | Source: Ambulatory Visit | Attending: Internal Medicine | Admitting: Internal Medicine

## 2017-09-09 DIAGNOSIS — R509 Fever, unspecified: Secondary | ICD-10-CM

## 2017-09-09 DIAGNOSIS — K8020 Calculus of gallbladder without cholecystitis without obstruction: Secondary | ICD-10-CM | POA: Diagnosis not present

## 2017-09-09 DIAGNOSIS — R1011 Right upper quadrant pain: Secondary | ICD-10-CM

## 2017-09-09 DIAGNOSIS — M549 Dorsalgia, unspecified: Secondary | ICD-10-CM

## 2017-09-09 MED ORDER — IOPAMIDOL (ISOVUE-300) INJECTION 61%
100.0000 mL | Freq: Once | INTRAVENOUS | Status: AC | PRN
  Administered 2017-09-09: 100 mL via INTRAVENOUS

## 2017-09-13 DIAGNOSIS — D72825 Bandemia: Secondary | ICD-10-CM | POA: Diagnosis not present

## 2017-09-13 DIAGNOSIS — R51 Headache: Secondary | ICD-10-CM | POA: Diagnosis not present

## 2017-09-13 DIAGNOSIS — R945 Abnormal results of liver function studies: Secondary | ICD-10-CM | POA: Diagnosis not present

## 2017-09-29 DIAGNOSIS — Z13820 Encounter for screening for osteoporosis: Secondary | ICD-10-CM | POA: Diagnosis not present

## 2017-10-05 DIAGNOSIS — R51 Headache: Secondary | ICD-10-CM | POA: Diagnosis not present

## 2017-10-05 DIAGNOSIS — R945 Abnormal results of liver function studies: Secondary | ICD-10-CM | POA: Diagnosis not present

## 2017-10-05 DIAGNOSIS — K589 Irritable bowel syndrome without diarrhea: Secondary | ICD-10-CM | POA: Diagnosis not present

## 2017-10-05 DIAGNOSIS — K5909 Other constipation: Secondary | ICD-10-CM | POA: Diagnosis not present

## 2018-01-04 DIAGNOSIS — D1800 Hemangioma unspecified site: Secondary | ICD-10-CM | POA: Diagnosis not present

## 2018-01-04 DIAGNOSIS — L719 Rosacea, unspecified: Secondary | ICD-10-CM | POA: Diagnosis not present

## 2018-01-04 DIAGNOSIS — L821 Other seborrheic keratosis: Secondary | ICD-10-CM | POA: Diagnosis not present

## 2018-01-04 DIAGNOSIS — L814 Other melanin hyperpigmentation: Secondary | ICD-10-CM | POA: Diagnosis not present

## 2018-03-24 ENCOUNTER — Other Ambulatory Visit: Payer: Self-pay | Admitting: Internal Medicine

## 2018-03-24 DIAGNOSIS — Z1231 Encounter for screening mammogram for malignant neoplasm of breast: Secondary | ICD-10-CM

## 2018-05-06 ENCOUNTER — Ambulatory Visit
Admission: RE | Admit: 2018-05-06 | Discharge: 2018-05-06 | Disposition: A | Discharge status: Home / Self Care | Payer: BLUE CROSS/BLUE SHIELD | Source: Ambulatory Visit | Attending: Internal Medicine | Admitting: Internal Medicine

## 2018-05-06 DIAGNOSIS — Z1231 Encounter for screening mammogram for malignant neoplasm of breast: Secondary | ICD-10-CM | POA: Diagnosis not present

## 2018-05-18 DIAGNOSIS — H40003 Preglaucoma, unspecified, bilateral: Secondary | ICD-10-CM | POA: Diagnosis not present

## 2018-06-30 DIAGNOSIS — M25562 Pain in left knee: Secondary | ICD-10-CM | POA: Diagnosis not present

## 2018-08-01 DIAGNOSIS — D692 Other nonthrombocytopenic purpura: Secondary | ICD-10-CM | POA: Diagnosis not present

## 2018-08-01 DIAGNOSIS — D229 Melanocytic nevi, unspecified: Secondary | ICD-10-CM | POA: Diagnosis not present

## 2018-08-01 DIAGNOSIS — D2272 Melanocytic nevi of left lower limb, including hip: Secondary | ICD-10-CM | POA: Diagnosis not present

## 2018-09-12 DIAGNOSIS — Z0000 Encounter for general adult medical examination without abnormal findings: Secondary | ICD-10-CM | POA: Diagnosis not present

## 2018-09-16 DIAGNOSIS — E039 Hypothyroidism, unspecified: Secondary | ICD-10-CM | POA: Diagnosis not present

## 2018-09-16 DIAGNOSIS — R945 Abnormal results of liver function studies: Secondary | ICD-10-CM | POA: Diagnosis not present

## 2018-09-16 DIAGNOSIS — I10 Essential (primary) hypertension: Secondary | ICD-10-CM | POA: Diagnosis not present

## 2018-09-16 DIAGNOSIS — E782 Mixed hyperlipidemia: Secondary | ICD-10-CM | POA: Diagnosis not present

## 2018-10-06 ENCOUNTER — Other Ambulatory Visit: Payer: Self-pay

## 2018-10-06 ENCOUNTER — Ambulatory Visit (AMBULATORY_SURGERY_CENTER): Payer: Self-pay | Admitting: *Deleted

## 2018-10-06 VITALS — Ht 64.5 in | Wt 129.4 lb

## 2018-10-06 DIAGNOSIS — Z86010 Personal history of colonic polyps: Secondary | ICD-10-CM

## 2018-10-06 MED ORDER — NA SULFATE-K SULFATE-MG SULF 17.5-3.13-1.6 GM/177ML PO SOLN: 1.0000 | Freq: Once | ORAL | 0 refills | Status: AC

## 2018-10-06 NOTE — Progress Notes (Signed)
No egg or soy allergy known to patient  No issues with past sedation with any surgeries  or procedures, no intubation problems  No diet pills per patient No home 02 use per patient  No blood thinners per patient  Pt denies issues with constipation  No A fib or A flutter  EMMI video sent to pt's e mail - declined  PV done in person today Suprep $15 coupon to pt in PV today

## 2018-10-10 ENCOUNTER — Telehealth: Payer: Self-pay

## 2018-10-10 NOTE — Telephone Encounter (Signed)
Left message

## 2018-10-11 ENCOUNTER — Telehealth: Payer: Self-pay | Admitting: *Deleted

## 2018-10-11 NOTE — Telephone Encounter (Signed)

## 2018-10-12 ENCOUNTER — Ambulatory Visit (AMBULATORY_SURGERY_CENTER): Payer: BLUE CROSS/BLUE SHIELD | Admitting: Gastroenterology

## 2018-10-12 ENCOUNTER — Encounter: Payer: Self-pay | Admitting: Gastroenterology

## 2018-10-12 ENCOUNTER — Other Ambulatory Visit: Payer: Self-pay

## 2018-10-12 VITALS — BP 98/62 | HR 72 | Temp 98.6°F | Resp 19 | Ht 64.0 in | Wt 129.0 lb

## 2018-10-12 DIAGNOSIS — D124 Benign neoplasm of descending colon: Secondary | ICD-10-CM

## 2018-10-12 DIAGNOSIS — Z86010 Personal history of colonic polyps: Secondary | ICD-10-CM

## 2018-10-12 HISTORY — PX: COLONOSCOPY: SHX174

## 2018-10-12 MED ORDER — SODIUM CHLORIDE 0.9 % IV SOLN: 500.0000 mL | Freq: Once | INTRAVENOUS | Status: DC

## 2018-10-12 NOTE — Progress Notes (Signed)
Courtney Washington took temp and Judy Branson took vitals. 

## 2018-10-12 NOTE — Op Note (Signed)
[facility] Patient Name: Robin Mcdonald Procedure Date: 10/12/2018 2:30 PM MRN: [id number] Endoscopist: Ladene Artist , MD Age: 68 Referring MD:  Date of Birth: April 16, 1951 Gender: Female Account #: [id number] Procedure:                Colonoscopy Indications:              Surveillance: Personal history of adenomatous                            polyps on last colonoscopy 3 years ago Medicines:                Monitored Anesthesia Care Procedure:                Pre-Anesthesia Assessment:                           - Prior to the procedure, a History and Physical                            was performed, and patient medications and                            allergies were reviewed. The patient's tolerance of                            previous anesthesia was also reviewed. The risks                            and benefits of the procedure and the sedation                            options and risks were discussed with the patient.                            All questions were answered, and informed consent                            was obtained. Prior Anticoagulants: The patient has                            taken no previous anticoagulant or antiplatelet                            agents. ASA Grade Assessment: II - A patient with                            mild systemic disease. After reviewing the risks                            and benefits, the patient was deemed in                            satisfactory condition to undergo the procedure.  After obtaining informed consent, the colonoscope                            was passed under direct vision. Throughout the                            procedure, the patient's blood pressure, pulse, and                            oxygen saturations were monitored continuously. The                            Colonoscope was introduced through the anus and                            advanced to the the cecum,  identified by                            appendiceal orifice and ileocecal valve. The                            ileocecal valve, appendiceal orifice, and rectum                            were photographed. The quality of the bowel                            preparation was good. The colonoscopy was performed                            without difficulty. The patient tolerated the                            procedure well. Scope In: 2:37:49 PM Scope Out: 2:54:52 PM Scope Withdrawal Time: 0 hours 11 minutes 42 seconds  Total Procedure Duration: 0 hours 17 minutes 3 seconds  Findings:                 The perianal and digital rectal examinations were                            normal.                           A 7 mm polyp was found in the descending colon. The                            polyp was sessile. The polyp was removed with a                            cold snare. Resection and retrieval were complete.                           Scattered small-mouthed diverticula were found in  the left colon.                           Internal hemorrhoids were found during                            retroflexion. The hemorrhoids were small and Grade                            I (internal hemorrhoids that do not prolapse).                           The exam was otherwise without abnormality on                            direct and retroflexion views. Complications:            No immediate complications. Estimated blood loss:                            None. Estimated Blood Loss:     Estimated blood loss: none. Impression:               - One 7 mm polyp in the descending colon, removed                            with a cold snare. Resected and retrieved.                           - Mild diverticulosis in the left colon.                           - Internal hemorrhoids.                           - The examination was otherwise normal on direct                             and retroflexion views. Recommendation:           - Repeat colonoscopy in 5 years for surveillance.                           - Patient has a contact number available for                            emergencies. The signs and symptoms of potential                            delayed complications were discussed with the                            patient. Return to normal activities tomorrow.                            Written discharge instructions were provided to the  patient.                           - Resume previous diet.                           - Continue present medications.                           - Await pathology results. Ladene Artist, MD 10/12/2018 2:58:47 PM This report has been signed electronically.

## 2018-10-12 NOTE — Progress Notes (Signed)
Called to room to assist during endoscopic procedure.  Patient ID and intended procedure confirmed with present staff. Received instructions for my participation in the procedure from the performing physician.  

## 2018-10-12 NOTE — Progress Notes (Signed)
Pt's states no medical or surgical changes since previsit or office visit. 

## 2018-10-12 NOTE — Patient Instructions (Signed)
YOU HAD AN ENDOSCOPIC PROCEDURE TODAY AT [facility]:   Refer to the procedure report that was given to you for any specific questions about what was found during the examination.  If the procedure report does not answer your questions, please call your gastroenterologist to clarify.  If you requested that your care partner not be given the details of your procedure findings, then the procedure report has been included in a sealed envelope for you to review at your convenience later.  YOU SHOULD EXPECT: Some feelings of bloating in the abdomen. Passage of more gas than usual.  Walking can help get rid of the air that was put into your GI tract during the procedure and reduce the bloating. If you had a lower endoscopy (such as a colonoscopy or flexible sigmoidoscopy) you may notice spotting of blood in your stool or on the toilet paper. If you underwent a bowel prep for your procedure, you may not have a normal bowel movement for a few days.  Please Note:  You might notice some irritation and congestion in your nose or some drainage.  This is from the oxygen used during your procedure.  There is no need for concern and it should clear up in a day or so.  SYMPTOMS TO REPORT IMMEDIATELY:   Following lower endoscopy (colonoscopy or flexible sigmoidoscopy):  Excessive amounts of blood in the stool  Significant tenderness or worsening of abdominal pains  Swelling of the abdomen that is new, acute  Fever of 100F or higher  Please see handouts given to you on Polyps, Diverticulosis and Hemorrhoids.  For urgent or emergent issues, a gastroenterologist can be reached at any hour by calling [phone].   DIET:  We do recommend a small meal at first, but then you may proceed to your regular diet.  Drink plenty of fluids but you should avoid alcoholic beverages for 24 hours.  ACTIVITY:  You should plan to take it easy for the rest of today and you should NOT DRIVE or use heavy  machinery until tomorrow (because of the sedation medicines used during the test).    FOLLOW UP: Our staff will call the number listed on your records 48-72 hours following your procedure to check on you and address any questions or concerns that you may have regarding the information given to you following your procedure. If we do not reach you, we will leave a message.  We will attempt to reach you two times.  During this call, we will ask if you have developed any symptoms of COVID 19. If you develop any symptoms (ie: fever, flu-like symptoms, shortness of breath, cough etc.) before then, please call [phone].  If you test positive for Covid 19 in the 2 weeks post procedure, please call and report this information to [place].    If any biopsies were taken you will be contacted by phone or by letter within the next 1-3 weeks.  Please call us at [phone] if you have not heard about the biopsies in 3 weeks.    SIGNATURES/CONFIDENTIALITY: You and/or your care partner have signed paperwork which will be entered into your electronic medical record.  These signatures attest to the fact that that the information above on your After Visit Summary has been reviewed and is understood.  Full responsibility of the confidentiality of this discharge information lies with you and/or your care-partner.  Thank you for letting us take care of your healthcare needs today.

## 2018-10-12 NOTE — Progress Notes (Signed)
Report given to PACU, vss 

## 2018-10-14 ENCOUNTER — Telehealth: Payer: Self-pay

## 2018-10-14 NOTE — Telephone Encounter (Signed)
  Follow up Call-  Call back number 10/12/2018  Post procedure Call Back phone  # [phone]  Permission to leave phone message Yes  Some recent data might be hidden     Patient questions:  Do you have a fever, pain , or abdominal swelling? No. Pain Score  0 *  Have you tolerated food without any problems? Yes.    Have you been able to return to your normal activities? Yes.    Do you have any questions about your discharge instructions: Diet   No. Medications  No. Follow up visit  No.  Do you have questions or concerns about your Care? No.  Actions: * If pain score is 4 or above: No action needed, pain <4.  1. Have you developed a fever since your procedure? No   2.   Have you had an respiratory symptoms (SOB or cough) since your procedure? no  3.   Have you tested positive for COVID 19 since your procedure no  4.   Have you had any family members/close contacts diagnosed with the COVID 19 since your procedure?  no   If yes to any of these questions please route to Joylene John, RN and Alphonsa Gin, Therapist, sports.

## 2018-10-18 ENCOUNTER — Encounter: Payer: Self-pay | Admitting: Gastroenterology

## 2018-12-21 DIAGNOSIS — E039 Hypothyroidism, unspecified: Secondary | ICD-10-CM | POA: Diagnosis not present

## 2019-01-11 DIAGNOSIS — L814 Other melanin hyperpigmentation: Secondary | ICD-10-CM | POA: Diagnosis not present

## 2019-01-11 DIAGNOSIS — L821 Other seborrheic keratosis: Secondary | ICD-10-CM | POA: Diagnosis not present

## 2019-01-11 DIAGNOSIS — D225 Melanocytic nevi of trunk: Secondary | ICD-10-CM | POA: Diagnosis not present

## 2019-01-11 DIAGNOSIS — D2272 Melanocytic nevi of left lower limb, including hip: Secondary | ICD-10-CM | POA: Diagnosis not present

## 2019-02-18 DIAGNOSIS — N390 Urinary tract infection, site not specified: Secondary | ICD-10-CM | POA: Diagnosis not present

## 2019-02-18 DIAGNOSIS — R319 Hematuria, unspecified: Secondary | ICD-10-CM | POA: Diagnosis not present

## 2019-05-18 DIAGNOSIS — H2513 Age-related nuclear cataract, bilateral: Secondary | ICD-10-CM | POA: Diagnosis not present

## 2019-05-18 DIAGNOSIS — H25013 Cortical age-related cataract, bilateral: Secondary | ICD-10-CM | POA: Diagnosis not present

## 2019-05-18 DIAGNOSIS — H18413 Arcus senilis, bilateral: Secondary | ICD-10-CM | POA: Diagnosis not present

## 2019-05-18 DIAGNOSIS — H25043 Posterior subcapsular polar age-related cataract, bilateral: Secondary | ICD-10-CM | POA: Diagnosis not present

## 2019-05-18 DIAGNOSIS — H2512 Age-related nuclear cataract, left eye: Secondary | ICD-10-CM | POA: Diagnosis not present

## 2019-07-03 DIAGNOSIS — H2512 Age-related nuclear cataract, left eye: Secondary | ICD-10-CM | POA: Diagnosis not present

## 2019-07-04 DIAGNOSIS — H2511 Age-related nuclear cataract, right eye: Secondary | ICD-10-CM | POA: Diagnosis not present

## 2019-07-17 DIAGNOSIS — H2511 Age-related nuclear cataract, right eye: Secondary | ICD-10-CM | POA: Diagnosis not present

## 2019-07-24 ENCOUNTER — Ambulatory Visit: Payer: BC Managed Care – PPO | Attending: Internal Medicine

## 2019-07-24 DIAGNOSIS — Z20822 Contact with and (suspected) exposure to covid-19: Secondary | ICD-10-CM | POA: Diagnosis not present

## 2019-07-25 LAB — NOVEL CORONAVIRUS, NAA: SARS-CoV-2, NAA: NOT DETECTED

## 2019-07-25 LAB — SARS-COV-2, NAA 2 DAY TAT

## 2019-07-28 ENCOUNTER — Other Ambulatory Visit: Payer: Self-pay | Admitting: Physician Assistant

## 2019-07-28 DIAGNOSIS — I10 Essential (primary) hypertension: Secondary | ICD-10-CM

## 2019-07-28 DIAGNOSIS — U071 COVID-19: Secondary | ICD-10-CM

## 2019-07-28 DIAGNOSIS — Z20828 Contact with and (suspected) exposure to other viral communicable diseases: Secondary | ICD-10-CM | POA: Diagnosis not present

## 2019-07-28 DIAGNOSIS — Z03818 Encounter for observation for suspected exposure to other biological agents ruled out: Secondary | ICD-10-CM | POA: Diagnosis not present

## 2019-07-28 MED ORDER — SODIUM CHLORIDE 0.9 % IV SOLN
700.0000 mg | Freq: Once | INTRAVENOUS | Status: AC
  Administered 2019-07-29: 10:00:00 700 mg via INTRAVENOUS
  Filled 2019-07-28: qty 700

## 2019-07-28 NOTE — Progress Notes (Signed)
  I connected by phone with Robin Mcdonald on 07/28/2019 at 3:04 PM to discuss the potential use of an new treatment for mild to moderate COVID-19 viral infection in non-hospitalized patients.  This patient is a 69 y.o. female that meets the FDA criteria for Emergency Use Authorization of bamlanivimab or casirivimab\imdevimab.  Has a (+) direct SARS-CoV-2 viral test result  Has mild or moderate COVID-19   Is ? 68 years of age and weighs ? 40 kg  Is NOT hospitalized due to COVID-19  Is NOT requiring oxygen therapy or requiring an increase in baseline oxygen flow rate due to COVID-19  Is within 10 days of symptom onset  Has at least one of the high risk factor(s) for progression to severe COVID-19 and/or hospitalization as defined in EUA.  Specific high risk criteria : >/= 68 yo & HTN   I have spoken and communicated the following to the patient or parent/caregiver:  1. FDA has authorized the emergency use of bamlanivimab and casirivimab\imdevimab for the treatment of mild to moderate COVID-19 in adults and pediatric patients with positive results of direct SARS-CoV-2 viral testing who are 33 years of age and older weighing at least 40 kg, and who are at high risk for progressing to severe COVID-19 and/or hospitalization.  2. The significant known and potential risks and benefits of bamlanivimab and casirivimab\imdevimab, and the extent to which such potential risks and benefits are unknown.  3. Information on available alternative treatments and the risks and benefits of those alternatives, including clinical trials.  4. Patients treated with bamlanivimab and casirivimab\imdevimab should continue to self-isolate and use infection control measures (e.g., wear mask, isolate, social distance, avoid sharing personal items, clean and disinfect "high touch" surfaces, and frequent handwashing) according to CDC guidelines.   5. The patient or parent/caregiver has the option to accept or refuse  bamlanivimab or casirivimab\imdevimab .  After reviewing this information with the patient, The patient agreed to proceed with receiving the bamlanimivab infusion and will be provided a copy of the Fact sheet prior to receiving the infusion.   Sx onset 07/25/19. Set up for infusion tomorrow 07/29/19 @ 9:30am. Directions given.   Angelena Form PA-C 07/28/2019 3:04 PM    22

## 2019-07-29 ENCOUNTER — Ambulatory Visit (HOSPITAL_COMMUNITY)
Admission: RE | Admit: 2019-07-29 | Discharge: 2019-07-29 | Disposition: A | Discharge status: Home / Self Care | Payer: BC Managed Care – PPO | Source: Ambulatory Visit | Attending: Pulmonary Disease | Admitting: Pulmonary Disease

## 2019-07-29 ENCOUNTER — Other Ambulatory Visit (HOSPITAL_COMMUNITY): Payer: Self-pay

## 2019-07-29 DIAGNOSIS — U071 COVID-19: Secondary | ICD-10-CM | POA: Diagnosis not present

## 2019-07-29 DIAGNOSIS — I10 Essential (primary) hypertension: Secondary | ICD-10-CM | POA: Insufficient documentation

## 2019-07-29 MED ORDER — EPINEPHRINE 0.3 MG/0.3ML IJ SOAJ: 0.3000 mg | Freq: Once | INTRAMUSCULAR | Status: DC | PRN

## 2019-07-29 MED ORDER — SODIUM CHLORIDE 0.9 % IV SOLN: INTRAVENOUS | Status: DC | PRN

## 2019-07-29 MED ORDER — DIPHENHYDRAMINE HCL 50 MG/ML IJ SOLN: 50.0000 mg | Freq: Once | INTRAMUSCULAR | Status: DC | PRN

## 2019-07-29 MED ORDER — FAMOTIDINE IN NACL 20-0.9 MG/50ML-% IV SOLN: 20.0000 mg | Freq: Once | INTRAVENOUS | Status: DC | PRN

## 2019-07-29 MED ORDER — METHYLPREDNISOLONE SODIUM SUCC 125 MG IJ SOLR: 125.0000 mg | Freq: Once | INTRAMUSCULAR | Status: DC | PRN

## 2019-07-29 MED ORDER — ALBUTEROL SULFATE HFA 108 (90 BASE) MCG/ACT IN AERS: 2.0000 | INHALATION_SPRAY | Freq: Once | RESPIRATORY_TRACT | Status: DC | PRN

## 2019-07-29 NOTE — Progress Notes (Signed)
  Diagnosis: COVID-19  Physician: Dr, Joya Gaskins  Procedure: [facility] Med: bamlanivimab infusion - Provided patient with bamlanimivab fact sheet for patients, parents and caregivers prior to infusion.  Complications: No immediate complications noted.  Discharge: Discharged home   Robin Mcdonald 07/29/2019

## 2019-07-29 NOTE — Discharge Instructions (Signed)

## 2019-08-22 DIAGNOSIS — U071 COVID-19: Secondary | ICD-10-CM | POA: Diagnosis not present

## 2019-08-22 DIAGNOSIS — R05 Cough: Secondary | ICD-10-CM | POA: Diagnosis not present

## 2019-09-21 ENCOUNTER — Other Ambulatory Visit: Payer: Self-pay | Admitting: Internal Medicine

## 2019-09-21 DIAGNOSIS — Z1231 Encounter for screening mammogram for malignant neoplasm of breast: Secondary | ICD-10-CM

## 2019-09-22 DIAGNOSIS — Z0000 Encounter for general adult medical examination without abnormal findings: Secondary | ICD-10-CM | POA: Diagnosis not present

## 2019-09-22 DIAGNOSIS — I2510 Atherosclerotic heart disease of native coronary artery without angina pectoris: Secondary | ICD-10-CM | POA: Diagnosis not present

## 2019-09-22 DIAGNOSIS — E559 Vitamin D deficiency, unspecified: Secondary | ICD-10-CM | POA: Diagnosis not present

## 2019-09-22 DIAGNOSIS — J45909 Unspecified asthma, uncomplicated: Secondary | ICD-10-CM | POA: Diagnosis not present

## 2019-09-22 DIAGNOSIS — Z23 Encounter for immunization: Secondary | ICD-10-CM | POA: Diagnosis not present

## 2019-09-22 DIAGNOSIS — I10 Essential (primary) hypertension: Secondary | ICD-10-CM | POA: Diagnosis not present

## 2019-09-22 DIAGNOSIS — E7800 Pure hypercholesterolemia, unspecified: Secondary | ICD-10-CM | POA: Diagnosis not present

## 2019-09-22 DIAGNOSIS — Z79899 Other long term (current) drug therapy: Secondary | ICD-10-CM | POA: Diagnosis not present

## 2019-09-22 DIAGNOSIS — E039 Hypothyroidism, unspecified: Secondary | ICD-10-CM | POA: Diagnosis not present

## 2019-09-28 ENCOUNTER — Other Ambulatory Visit: Payer: Self-pay

## 2019-09-28 ENCOUNTER — Ambulatory Visit
Admission: RE | Admit: 2019-09-28 | Discharge: 2019-09-28 | Disposition: A | Discharge status: Home / Self Care | Payer: BC Managed Care – PPO | Source: Ambulatory Visit | Attending: Internal Medicine | Admitting: Internal Medicine

## 2019-09-28 DIAGNOSIS — Z1231 Encounter for screening mammogram for malignant neoplasm of breast: Secondary | ICD-10-CM

## 2019-10-18 DIAGNOSIS — H04332 Acute lacrimal canaliculitis of left lacrimal passage: Secondary | ICD-10-CM | POA: Diagnosis not present

## 2019-12-19 DIAGNOSIS — R310 Gross hematuria: Secondary | ICD-10-CM | POA: Diagnosis not present

## 2019-12-19 DIAGNOSIS — Z87440 Personal history of urinary (tract) infections: Secondary | ICD-10-CM | POA: Diagnosis not present

## 2019-12-27 DIAGNOSIS — D171 Benign lipomatous neoplasm of skin and subcutaneous tissue of trunk: Secondary | ICD-10-CM | POA: Diagnosis not present

## 2019-12-27 DIAGNOSIS — L650 Telogen effluvium: Secondary | ICD-10-CM | POA: Diagnosis not present

## 2019-12-27 DIAGNOSIS — L820 Inflamed seborrheic keratosis: Secondary | ICD-10-CM | POA: Diagnosis not present

## 2019-12-27 DIAGNOSIS — I781 Nevus, non-neoplastic: Secondary | ICD-10-CM | POA: Diagnosis not present

## 2020-01-24 DIAGNOSIS — L814 Other melanin hyperpigmentation: Secondary | ICD-10-CM | POA: Diagnosis not present

## 2020-01-24 DIAGNOSIS — D2272 Melanocytic nevi of left lower limb, including hip: Secondary | ICD-10-CM | POA: Diagnosis not present

## 2020-01-24 DIAGNOSIS — L821 Other seborrheic keratosis: Secondary | ICD-10-CM | POA: Diagnosis not present

## 2020-01-24 DIAGNOSIS — D225 Melanocytic nevi of trunk: Secondary | ICD-10-CM | POA: Diagnosis not present

## 2020-02-19 DIAGNOSIS — D171 Benign lipomatous neoplasm of skin and subcutaneous tissue of trunk: Secondary | ICD-10-CM | POA: Diagnosis not present

## 2020-05-17 DIAGNOSIS — H1012 Acute atopic conjunctivitis, left eye: Secondary | ICD-10-CM | POA: Diagnosis not present

## 2020-07-17 ENCOUNTER — Other Ambulatory Visit: Payer: Self-pay | Admitting: Physician Assistant

## 2020-07-17 ENCOUNTER — Ambulatory Visit
Admission: RE | Admit: 2020-07-17 | Discharge: 2020-07-17 | Disposition: A | Discharge status: Home / Self Care | Payer: BC Managed Care – PPO | Source: Ambulatory Visit | Attending: Physician Assistant | Admitting: Physician Assistant

## 2020-07-17 ENCOUNTER — Other Ambulatory Visit: Payer: Self-pay

## 2020-07-17 DIAGNOSIS — S6992XA Unspecified injury of left wrist, hand and finger(s), initial encounter: Secondary | ICD-10-CM | POA: Diagnosis not present

## 2020-07-17 DIAGNOSIS — M7989 Other specified soft tissue disorders: Secondary | ICD-10-CM | POA: Diagnosis not present

## 2020-09-04 DIAGNOSIS — S86202A Unspecified injury of muscle(s) and tendon(s) of anterior muscle group at lower leg level, left leg, initial encounter: Secondary | ICD-10-CM | POA: Diagnosis not present

## 2020-09-11 DIAGNOSIS — M25552 Pain in left hip: Secondary | ICD-10-CM | POA: Diagnosis not present

## 2020-09-26 DIAGNOSIS — Z0000 Encounter for general adult medical examination without abnormal findings: Secondary | ICD-10-CM | POA: Diagnosis not present

## 2020-09-26 DIAGNOSIS — E039 Hypothyroidism, unspecified: Secondary | ICD-10-CM | POA: Diagnosis not present

## 2020-09-26 DIAGNOSIS — J45909 Unspecified asthma, uncomplicated: Secondary | ICD-10-CM | POA: Diagnosis not present

## 2020-09-26 DIAGNOSIS — E559 Vitamin D deficiency, unspecified: Secondary | ICD-10-CM | POA: Diagnosis not present

## 2020-09-26 DIAGNOSIS — I10 Essential (primary) hypertension: Secondary | ICD-10-CM | POA: Diagnosis not present

## 2020-09-26 DIAGNOSIS — E7800 Pure hypercholesterolemia, unspecified: Secondary | ICD-10-CM | POA: Diagnosis not present

## 2020-09-26 DIAGNOSIS — I2510 Atherosclerotic heart disease of native coronary artery without angina pectoris: Secondary | ICD-10-CM | POA: Diagnosis not present

## 2020-10-07 DIAGNOSIS — H26492 Other secondary cataract, left eye: Secondary | ICD-10-CM | POA: Diagnosis not present

## 2020-10-08 DIAGNOSIS — H26491 Other secondary cataract, right eye: Secondary | ICD-10-CM | POA: Diagnosis not present

## 2020-10-08 DIAGNOSIS — Z961 Presence of intraocular lens: Secondary | ICD-10-CM | POA: Diagnosis not present

## 2020-10-08 DIAGNOSIS — H26492 Other secondary cataract, left eye: Secondary | ICD-10-CM | POA: Diagnosis not present

## 2020-10-08 DIAGNOSIS — H18413 Arcus senilis, bilateral: Secondary | ICD-10-CM | POA: Diagnosis not present

## 2020-10-22 DIAGNOSIS — H26491 Other secondary cataract, right eye: Secondary | ICD-10-CM | POA: Diagnosis not present

## 2020-10-28 ENCOUNTER — Other Ambulatory Visit: Payer: Self-pay | Admitting: Internal Medicine

## 2020-10-28 DIAGNOSIS — Z1231 Encounter for screening mammogram for malignant neoplasm of breast: Secondary | ICD-10-CM

## 2020-12-12 DIAGNOSIS — B351 Tinea unguium: Secondary | ICD-10-CM | POA: Diagnosis not present

## 2020-12-19 ENCOUNTER — Other Ambulatory Visit: Payer: Self-pay

## 2020-12-19 ENCOUNTER — Ambulatory Visit
Admission: RE | Admit: 2020-12-19 | Discharge: 2020-12-19 | Disposition: A | Discharge status: Home / Self Care | Payer: BC Managed Care – PPO | Source: Ambulatory Visit

## 2020-12-19 DIAGNOSIS — Z1231 Encounter for screening mammogram for malignant neoplasm of breast: Secondary | ICD-10-CM

## 2021-04-14 DIAGNOSIS — L03011 Cellulitis of right finger: Secondary | ICD-10-CM | POA: Diagnosis not present

## 2021-05-13 DIAGNOSIS — L578 Other skin changes due to chronic exposure to nonionizing radiation: Secondary | ICD-10-CM | POA: Diagnosis not present

## 2021-05-13 DIAGNOSIS — D2271 Melanocytic nevi of right lower limb, including hip: Secondary | ICD-10-CM | POA: Diagnosis not present

## 2021-05-13 DIAGNOSIS — L570 Actinic keratosis: Secondary | ICD-10-CM | POA: Diagnosis not present

## 2021-05-13 DIAGNOSIS — D2272 Melanocytic nevi of left lower limb, including hip: Secondary | ICD-10-CM | POA: Diagnosis not present

## 2021-05-13 DIAGNOSIS — L309 Dermatitis, unspecified: Secondary | ICD-10-CM | POA: Diagnosis not present

## 2021-08-07 DIAGNOSIS — M79644 Pain in right finger(s): Secondary | ICD-10-CM | POA: Diagnosis not present

## 2021-10-01 DIAGNOSIS — E559 Vitamin D deficiency, unspecified: Secondary | ICD-10-CM | POA: Diagnosis not present

## 2021-10-01 DIAGNOSIS — R109 Unspecified abdominal pain: Secondary | ICD-10-CM | POA: Diagnosis not present

## 2021-10-01 DIAGNOSIS — I2510 Atherosclerotic heart disease of native coronary artery without angina pectoris: Secondary | ICD-10-CM | POA: Diagnosis not present

## 2021-10-01 DIAGNOSIS — E039 Hypothyroidism, unspecified: Secondary | ICD-10-CM | POA: Diagnosis not present

## 2021-10-01 DIAGNOSIS — Z0000 Encounter for general adult medical examination without abnormal findings: Secondary | ICD-10-CM | POA: Diagnosis not present

## 2021-10-01 DIAGNOSIS — E7800 Pure hypercholesterolemia, unspecified: Secondary | ICD-10-CM | POA: Diagnosis not present

## 2021-10-01 DIAGNOSIS — I10 Essential (primary) hypertension: Secondary | ICD-10-CM | POA: Diagnosis not present

## 2021-11-07 ENCOUNTER — Other Ambulatory Visit: Payer: Self-pay | Admitting: Internal Medicine

## 2021-11-07 DIAGNOSIS — Z1231 Encounter for screening mammogram for malignant neoplasm of breast: Secondary | ICD-10-CM

## 2021-11-08 DIAGNOSIS — N3001 Acute cystitis with hematuria: Secondary | ICD-10-CM | POA: Diagnosis not present

## 2021-11-08 DIAGNOSIS — R319 Hematuria, unspecified: Secondary | ICD-10-CM | POA: Diagnosis not present

## 2021-12-12 DIAGNOSIS — R310 Gross hematuria: Secondary | ICD-10-CM | POA: Diagnosis not present

## 2021-12-19 DIAGNOSIS — N3289 Other specified disorders of bladder: Secondary | ICD-10-CM | POA: Diagnosis not present

## 2021-12-19 DIAGNOSIS — R310 Gross hematuria: Secondary | ICD-10-CM | POA: Diagnosis not present

## 2021-12-22 ENCOUNTER — Ambulatory Visit
Admission: RE | Admit: 2021-12-22 | Discharge: 2021-12-22 | Disposition: A | Discharge status: Home / Self Care | Payer: BC Managed Care – PPO | Source: Ambulatory Visit | Attending: Internal Medicine | Admitting: Internal Medicine

## 2021-12-22 DIAGNOSIS — Z1231 Encounter for screening mammogram for malignant neoplasm of breast: Secondary | ICD-10-CM | POA: Diagnosis not present

## 2021-12-29 DIAGNOSIS — R310 Gross hematuria: Secondary | ICD-10-CM | POA: Diagnosis not present

## 2022-03-13 DIAGNOSIS — H04123 Dry eye syndrome of bilateral lacrimal glands: Secondary | ICD-10-CM | POA: Diagnosis not present

## 2022-04-02 DIAGNOSIS — D414 Neoplasm of uncertain behavior of bladder: Secondary | ICD-10-CM | POA: Diagnosis not present

## 2022-04-02 DIAGNOSIS — R310 Gross hematuria: Secondary | ICD-10-CM | POA: Diagnosis not present

## 2022-04-03 ENCOUNTER — Other Ambulatory Visit: Payer: Self-pay | Admitting: Urology

## 2022-04-16 ENCOUNTER — Encounter (HOSPITAL_BASED_OUTPATIENT_CLINIC_OR_DEPARTMENT_OTHER): Payer: Self-pay | Admitting: Urology

## 2022-04-16 NOTE — Progress Notes (Signed)
Spoke w/ via phone for pre-op interview--- [place]----  EKG and ISTAT             Lab results------ COVID test -----patient states asymptomatic no test needed Arrive at -------0845 NPO after MN NO Solid Food.  Clear liquids from MN until---0745 Med rec completed Medications to take morning of surgery -----Synthroid and Advair Diabetic medication ----- Patient instructed no nail polish to be worn day of surgery Patient instructed to bring photo id and insurance card day of surgery Patient aware to have Driver (ride ) / caregiver  Husband Robin Mcdonald  for 24 hours after surgery  Patient Special Instructions ----- Pre-Op special Istructions ----- Patient verbalized understanding of instructions that were given at this phone interview. Patient denies shortness of breath, chest pain, fever, cough at this phone interview.

## 2022-04-20 NOTE — H&P (Signed)
Patient is a 71 year old white female seen today for evaluation of recurrent painless gross hematuria. The patient states she is had this at least 1-2 times a year over the last 5 years but had a recent episode approximate 1 month ago with passage of some clots. Denies any dysuria at the time of her episodes. Does have remote history of kidney stones she states based on prior imaging but no intervention never needed. Patient is a non-smoker. Urine currently clear.  Micro urinalysis today is clear on urine spun sediment  -12/29/21-patient with history of painless gross hematuria. Patient had renal CT scan on 12/22/2021 which was reviewed and shows essentially normal upper urinary tract and bladder. Now here for cystoscopy to assess bladder.  Micro urinalysis  Cystoscopy shows small erythematous area perhaps 1 cm in size which is smooth on the left posterior lateral bladder wall. Not suspicious for malignancy but definitely present. Remainder the bladder appeared grossly normal ureteral orifice ease in normal position effluxing clear urine.Marland Kitchen     CLINICAL DATA: Intermittent gross hematuria.   EXAM:  CT ABDOMEN AND PELVIS WITHOUT AND WITH CONTRAST   TECHNIQUE:  Multidetector CT imaging of the abdomen and pelvis was performed  following the standard protocol before and following the bolus  administration of intravenous contrast.   RADIATION DOSE REDUCTION: This exam was performed according to the  departmental dose-optimization program which includes automated  exposure control, adjustment of the mA and/or kV according to  patient size and/or use of iterative reconstruction technique.   CONTRAST: 125 cc of Omnipaque 300   COMPARISON: CT Sep 09, 2017   FINDINGS:  Lower chest: Elevation left hemidiaphragm.   Hepatobiliary: No suspicious hepatic lesion. Hepatomegaly with  diffuse hepatic steatosis. Cholelithiasis without findings of acute  cholecystitis. No biliary ductal dilation.   Pancreas:  No pancreatic ductal dilation or evidence of acute  inflammation.   Spleen: No splenomegaly or focal splenic lesion.   Adrenals/Urinary Tract: Bilateral adrenal glands appear normal.   No hydronephrosis. No renal, ureteral or bladder calculi identified.  Pelvic phleboliths with phleboliths along the course of the right  gonadal vein.   No suspicious renal mass.   Kidneys demonstrate symmetric enhancement and excretion of contrast  material. No collecting system duplication. No suspicious filling  defect identified within the opacified portions of the collecting  systems or ureters on delayed imaging. Distal left ureter is  incompletely opacified on both delayed postcontrast imaging  acquisitions limiting evaluation.   Urinary bladder is unremarkable for degree of distension without  focal wall thickening or suspicious intraluminal filling defect.   Stomach/Bowel: No radiopaque enteric contrast material was  administered. Stomach is unremarkable for degree of distension. No  pathologic dilation of small or large bowel. Moderate volume of  formed stool throughout the colon. No evidence of acute bowel  inflammation.   Vascular/Lymphatic: Aortic atherosclerosis. No pathologically  enlarged abdominal or pelvic lymph nodes.   Reproductive: Status post hysterectomy. No adnexal masses.   Other: No significant abdominopelvic free fluid.   Musculoskeletal: No acute osseous abnormality. Multilevel  degenerative changes spine. Degenerative changes bilateral hips.   IMPRESSION:  1. No hydronephrosis. No renal, ureteral or bladder calculi.  2. No solid enhancing renal mass.  3. Hepatomegaly with diffuse hepatic steatosis.  4. Aortic Atherosclerosis (ICD10-I70.0).    Electronically Signed  By: Dahlia Bailiff M.D.  On: 12/22/2021 09:53  -04/02/22-patient with history of painless gross hematuria back in August 2023. CT scan essentially negative but cystoscopy showed a small  erythematous area which appeared inflammatory. Cytology was negative. We opted for surveillance here for follow-up surveillance cystoscopy on this area. No interim gross hematuria.  Micro urinalysis is clear on urine spun sediment  Cystoscopy is performed today and shows: Slightly raised erythematous area in the left posterior bladder wall. Remainder of the bladder appears normal. Area measures at least a centimeter to 2 cm in size     ALLERGIES: Lodine Penicillin Sulfa Tussinex    MEDICATIONS: Albuterol Sulfate Hfa 90 mcg hfa aerosol with adapter 1 PO Daily  Calcium  Estradiol 1 mg tablet 1 tablet PO Daily  Fenofibrate 145 mg tablet 1 tablet PO Daily  Glycopyrrolate 2 mg tablet 1 tablet PO Daily  Levothyroxine Sodium 88 mcg tablet 1 tablet PO Daily  Multivitamin  Omeprazole 40 mg capsule,delayed release 1 capsule PO Daily  Valsartan-Hydrochlorothiazide 160 mg-25 mg tablet 1 tablet PO Daily  Vitamin D3  Wixela Inhub 250 mcg-50 mcg/dose blister, with inhalation device 1 PO Daily     GU PSH: Cystoscopy - 12/29/2021 Locm 300-'399Mg'$ /Ml Iodine,1Ml - 12/19/2021     NON-GU PSH: Anesth, Ear Surgery Bilateral Tubal Ligation Hysterectomy     GU PMH: Gross hematuria - 12/29/2021, - 12/19/2021, - 12/12/2021    NON-GU PMH: Arthritis Asthma GERD Hypercholesterolemia Hypertension Hypothyroidism    FAMILY HISTORY: Kidney Cancer - Father Lung Cancer - Father, Mother   SOCIAL HISTORY: Marital Status: Married Ethnicity: Not Hispanic Or Latino; Race: White Current Smoking Status: Patient has never smoked.   Tobacco Use Assessment Completed: Used Tobacco in last 30 days? Has never drank.  Drinks 2 caffeinated drinks per day.    REVIEW OF SYSTEMS:    GU Review Female:   Patient denies frequent urination, hard to postpone urination, burning /pain with urination, get up at night to urinate, leakage of urine, stream starts and stops, trouble starting your stream, have to strain to  urinate, and being pregnant.  Gastrointestinal (Upper):   Patient denies nausea, vomiting, and indigestion/ heartburn.  Gastrointestinal (Lower):   Patient denies diarrhea and constipation.  Constitutional:   Patient denies fever, night sweats, weight loss, and fatigue.  Skin:   Patient denies skin rash/ lesion and itching.  Eyes:   Patient denies blurred vision and double vision.  Ears/ Nose/ Throat:   Patient denies sore throat and sinus problems.  Hematologic/Lymphatic:   Patient denies swollen glands and easy bruising.  Cardiovascular:   Patient denies leg swelling and chest pains.  Respiratory:   Patient denies cough and shortness of breath.  Endocrine:   Patient denies excessive thirst.  Musculoskeletal:   Patient denies back pain and joint pain.  Neurological:   Patient denies headaches and dizziness.  Psychologic:   Patient denies anxiety and depression.   VITAL SIGNS: None   GU PHYSICAL EXAMINATION:    External Genitalia: No hirsutism, no rash, no scarring, no cyst, no erythematous lesion, no papular lesion, no blanched lesion, no warty lesion. No edema.  Urethral Meatus: Normal size. Normal position. No discharge.  Urethra: No tenderness, no mass, no scarring. No hypermobility. No leakage.  Bladder: Normal to palpation, no tenderness, no mass, normal size.  Vagina: No atrophy, no stenosis. No rectocele. No cystocele. No enterocele.  Cervix: S/P Hysterectomy  Uterus: S/P Hysterectomy   MULTI-SYSTEM PHYSICAL EXAMINATION:    Constitutional: Well-nourished. No physical deformities. Normally developed. Good grooming.  Neck: Neck symmetrical, not swollen. Normal tracheal position.  Respiratory: No labored breathing, no use of accessory muscles.   Cardiovascular: Normal temperature,  normal extremity pulses, no swelling, no varicosities.  Lymphatic: No enlargement of neck, axillae, groin.  Skin: No paleness, no jaundice, no cyanosis. No lesion, no ulcer, no rash.  Neurologic /  Psychiatric: Oriented to time, oriented to place, oriented to person. No depression, no anxiety, no agitation.  Eyes: Normal conjunctivae. Normal eyelids.  Ears, Nose, Mouth, and Throat: Left ear no scars, no lesions, no masses. Right ear no scars, no lesions, no masses. Nose no scars, no lesions, no masses. Normal hearing. Normal lips.  Musculoskeletal: Normal gait and station of head and neck.     Complexity of Data:  Source Of History:  Patient  Lab Test Review:   Urine Cytology  Records Review:   Previous Doctor Records, Previous Patient Records  Urine Test Review:   Urinalysis   PROCEDURES:          Urinalysis - 81003 Dipstick Dipstick Cont'd  Color: Yellow Bilirubin: Neg mg/dL  Appearance: Clear Ketones: Neg mg/dL  Specific Gravity: 1.020 Blood: Neg ery/uL  pH: 6.0 Protein: Neg mg/dL  Glucose: Neg mg/dL Urobilinogen: 0.2 mg/dL    Nitrites: Neg    Leukocyte Esterase: Neg leu/uL    ASSESSMENT:      ICD-10 Details  1 GU:   Gross hematuria - D17.6 Acute, Complicated Injury  2   Bladder tumor/neoplasm - D41.4 Acute, Complicated Injury   PLAN:           Document Letter(s):  Created for Patient: Clinical Summary         Notes:   We discussed cystoscopic findings and she saw the lesion on monitor today during cystoscopy. Recommended cystoscopy with bladder biopsy/TURBT and fulguration and retrogrades. We will schedule accordingly in the near future risk and benefits discussed as outlined below.  TURBT consent: I have discussed with the patient the risks, benefits of TURBT which include but are not limited to: Bleeding, infection, damage to the bladder with potential perforation of the bladder, damage to surrounding organs, possible need for further procedures including open repair and catheterization, possibility of nonhealing area within the bladder, urgency, frequency which may be refractory to medications. I pointed out that in some occasions after resection of the bladder  tumor, mitomycin-C chemotherapy may be instilled into the bladder. The risks associated with this therapy include but are not limited to: Refractory or new onset urgency, frequency, dysuria, infrequently severe systemic side effects secondary to mitomycin-C. After full discussion of the risks, benefits and alternatives, the patient has consented to the above procedure and desires to proceed.

## 2022-04-21 ENCOUNTER — Ambulatory Visit (HOSPITAL_BASED_OUTPATIENT_CLINIC_OR_DEPARTMENT_OTHER)
Admission: RE | Admit: 2022-04-21 | Discharge: 2022-04-21 | Disposition: A | Discharge status: Home / Self Care | Payer: BC Managed Care – PPO | Attending: Urology | Admitting: Urology

## 2022-04-21 ENCOUNTER — Ambulatory Visit (HOSPITAL_BASED_OUTPATIENT_CLINIC_OR_DEPARTMENT_OTHER): Payer: BC Managed Care – PPO | Admitting: Anesthesiology

## 2022-04-21 ENCOUNTER — Encounter (HOSPITAL_BASED_OUTPATIENT_CLINIC_OR_DEPARTMENT_OTHER)
Admission: RE | Disposition: A | Discharge status: Home / Self Care | Payer: Self-pay | Source: Home / Self Care | Attending: Urology

## 2022-04-21 ENCOUNTER — Encounter (HOSPITAL_BASED_OUTPATIENT_CLINIC_OR_DEPARTMENT_OTHER): Payer: Self-pay | Admitting: Urology

## 2022-04-21 ENCOUNTER — Other Ambulatory Visit: Payer: Self-pay

## 2022-04-21 DIAGNOSIS — D494 Neoplasm of unspecified behavior of bladder: Secondary | ICD-10-CM | POA: Diagnosis not present

## 2022-04-21 DIAGNOSIS — K219 Gastro-esophageal reflux disease without esophagitis: Secondary | ICD-10-CM | POA: Insufficient documentation

## 2022-04-21 DIAGNOSIS — Z87442 Personal history of urinary calculi: Secondary | ICD-10-CM | POA: Insufficient documentation

## 2022-04-21 DIAGNOSIS — R310 Gross hematuria: Secondary | ICD-10-CM | POA: Insufficient documentation

## 2022-04-21 DIAGNOSIS — J45909 Unspecified asthma, uncomplicated: Secondary | ICD-10-CM | POA: Insufficient documentation

## 2022-04-21 DIAGNOSIS — N329 Bladder disorder, unspecified: Secondary | ICD-10-CM | POA: Diagnosis not present

## 2022-04-21 DIAGNOSIS — D303 Benign neoplasm of bladder: Secondary | ICD-10-CM | POA: Diagnosis not present

## 2022-04-21 DIAGNOSIS — E039 Hypothyroidism, unspecified: Secondary | ICD-10-CM | POA: Insufficient documentation

## 2022-04-21 DIAGNOSIS — K449 Diaphragmatic hernia without obstruction or gangrene: Secondary | ICD-10-CM | POA: Diagnosis not present

## 2022-04-21 DIAGNOSIS — I10 Essential (primary) hypertension: Secondary | ICD-10-CM

## 2022-04-21 HISTORY — PX: CYSTOSCOPY W/ RETROGRADES: SHX1426

## 2022-04-21 HISTORY — PX: TRANSURETHRAL RESECTION OF BLADDER TUMOR: SHX2575

## 2022-04-21 LAB — POCT I-STAT, CHEM 8
BUN: 26 mg/dL — ABNORMAL HIGH (ref 8–23)
Calcium, Ion: 1.2 mmol/L (ref 1.15–1.40)
Chloride: 102 mmol/L (ref 98–111)
Creatinine, Ser: 0.8 mg/dL (ref 0.44–1.00)
Glucose, Bld: 85 mg/dL (ref 70–99)
HCT: 43 % (ref 36.0–46.0)
Hemoglobin: 14.6 g/dL (ref 12.0–15.0)
Potassium: 3.6 mmol/L (ref 3.5–5.1)
Sodium: 141 mmol/L (ref 135–145)
TCO2: 28 mmol/L (ref 22–32)

## 2022-04-21 SURGERY — TURBT (TRANSURETHRAL RESECTION OF BLADDER TUMOR)
Anesthesia: General | Site: Bladder

## 2022-04-21 MED ORDER — ONDANSETRON HCL 4 MG/2ML IJ SOLN
INTRAMUSCULAR | Status: DC | PRN
  Administered 2022-04-21: 4 mg via INTRAVENOUS

## 2022-04-21 MED ORDER — CEFAZOLIN SODIUM-DEXTROSE 2-4 GM/100ML-% IV SOLN
INTRAVENOUS | Status: AC
  Filled 2022-04-21: qty 100

## 2022-04-21 MED ORDER — CEFAZOLIN SODIUM-DEXTROSE 2-4 GM/100ML-% IV SOLN
2.0000 g | INTRAVENOUS | Status: AC
  Administered 2022-04-21: 2 g via INTRAVENOUS

## 2022-04-21 MED ORDER — LIDOCAINE HCL (PF) 2 % IJ SOLN
INTRAMUSCULAR | Status: AC
  Filled 2022-04-21: qty 5

## 2022-04-21 MED ORDER — PROPOFOL 10 MG/ML IV BOLUS: INTRAVENOUS | Status: DC | PRN

## 2022-04-21 MED ORDER — TRAMADOL HCL 50 MG PO TABS: 50.0000 mg | ORAL_TABLET | Freq: Four times a day (QID) | ORAL | 0 refills | Status: DC | PRN

## 2022-04-21 MED ORDER — PROPOFOL 10 MG/ML IV BOLUS
INTRAVENOUS | Status: AC
  Filled 2022-04-21: qty 20

## 2022-04-21 MED ORDER — DEXAMETHASONE SODIUM PHOSPHATE 10 MG/ML IJ SOLN
INTRAMUSCULAR | Status: AC
  Filled 2022-04-21: qty 1

## 2022-04-21 MED ORDER — OXYCODONE HCL 5 MG PO TABS
5.0000 mg | ORAL_TABLET | ORAL | Status: AC
  Administered 2022-04-21: 5 mg via ORAL

## 2022-04-21 MED ORDER — FENTANYL CITRATE (PF) 100 MCG/2ML IJ SOLN
INTRAMUSCULAR | Status: AC
  Filled 2022-04-21: qty 2

## 2022-04-21 MED ORDER — STERILE WATER FOR IRRIGATION IR SOLN
Status: DC | PRN
  Administered 2022-04-21: 3000 mL

## 2022-04-21 MED ORDER — ONDANSETRON HCL 4 MG/2ML IJ SOLN
INTRAMUSCULAR | Status: AC
  Filled 2022-04-21: qty 2

## 2022-04-21 MED ORDER — LACTATED RINGERS IV SOLN: INTRAVENOUS | Status: DC

## 2022-04-21 MED ORDER — FENTANYL CITRATE (PF) 100 MCG/2ML IJ SOLN
INTRAMUSCULAR | Status: DC | PRN
  Administered 2022-04-21: 50 ug via INTRAVENOUS

## 2022-04-21 MED ORDER — PROPOFOL 10 MG/ML IV BOLUS
INTRAVENOUS | Status: DC | PRN
  Administered 2022-04-21: 100 mg via INTRAVENOUS

## 2022-04-21 MED ORDER — DEXAMETHASONE SODIUM PHOSPHATE 10 MG/ML IJ SOLN
INTRAMUSCULAR | Status: DC | PRN
  Administered 2022-04-21: 5 mg via INTRAVENOUS

## 2022-04-21 MED ORDER — MIDAZOLAM HCL 2 MG/2ML IJ SOLN
INTRAMUSCULAR | Status: AC
  Filled 2022-04-21: qty 2

## 2022-04-21 MED ORDER — LIDOCAINE 2% (20 MG/ML) 5 ML SYRINGE
INTRAMUSCULAR | Status: DC | PRN
  Administered 2022-04-21: 40 mg via INTRAVENOUS

## 2022-04-21 MED ORDER — ACETAMINOPHEN 10 MG/ML IV SOLN: 1000.0000 mg | Freq: Once | INTRAVENOUS | Status: DC | PRN

## 2022-04-21 MED ORDER — FENTANYL CITRATE (PF) 100 MCG/2ML IJ SOLN: 25.0000 ug | INTRAMUSCULAR | Status: DC | PRN

## 2022-04-21 MED ORDER — EPHEDRINE SULFATE-NACL 50-0.9 MG/10ML-% IV SOSY
PREFILLED_SYRINGE | INTRAVENOUS | Status: DC | PRN
  Administered 2022-04-21: 10 mg via INTRAVENOUS
  Administered 2022-04-21: 15 mg via INTRAVENOUS

## 2022-04-21 MED ORDER — OXYCODONE HCL 5 MG PO TABS
ORAL_TABLET | ORAL | Status: AC
  Filled 2022-04-21: qty 1

## 2022-04-21 MED ORDER — MIDAZOLAM HCL 2 MG/2ML IJ SOLN
INTRAMUSCULAR | Status: DC | PRN
  Administered 2022-04-21: 1 mg via INTRAVENOUS

## 2022-04-21 MED ORDER — IOHEXOL 300 MG/ML  SOLN
INTRAMUSCULAR | Status: DC | PRN
  Administered 2022-04-21: 10 mL via URETHRAL

## 2022-04-21 SURGICAL SUPPLY — 36 items
BAG DRAIN URO-CYSTO SKYTR STRL (DRAIN) ×2 IMPLANT
BAG DRN RND TRDRP ANRFLXCHMBR (UROLOGICAL SUPPLIES)
BAG DRN UROCATH (DRAIN) ×2
BAG URINE DRAIN 2000ML AR STRL (UROLOGICAL SUPPLIES) IMPLANT
BAG URINE LEG 500ML (DRAIN) IMPLANT
BULB IRRIG PATHFIND (MISCELLANEOUS) IMPLANT
CATH FOLEY 2WAY SLVR  5CC 20FR (CATHETERS)
CATH FOLEY 2WAY SLVR  5CC 22FR (CATHETERS)
CATH FOLEY 2WAY SLVR 5CC 20FR (CATHETERS) IMPLANT
CATH FOLEY 2WAY SLVR 5CC 22FR (CATHETERS) IMPLANT
CATH URET 5FR 28IN CONE TIP (BALLOONS)
CATH URET 5FR 70CM CONE TIP (BALLOONS) IMPLANT
CATH URETL OPEN 5X70 (CATHETERS) IMPLANT
CLOTH BEACON ORANGE TIMEOUT ST (SAFETY) ×2 IMPLANT
ELECT REM PT RETURN 9FT ADLT (ELECTROSURGICAL) ×2
ELECTRODE REM PT RTRN 9FT ADLT (ELECTROSURGICAL) ×2 IMPLANT
EVACUATOR MICROVAS BLADDER (UROLOGICAL SUPPLIES) IMPLANT
FIBER LASER FLEXIVA 365 (UROLOGICAL SUPPLIES) IMPLANT
GLOVE BIO SURGEON STRL SZ7.5 (GLOVE) ×2 IMPLANT
GOWN STRL REUS W/TWL LRG LVL3 (GOWN DISPOSABLE) ×2 IMPLANT
GUIDEWIRE ANG ZIPWIRE 038X150 (WIRE) IMPLANT
GUIDEWIRE STR DUAL SENSOR (WIRE) IMPLANT
HOLDER FOLEY CATH W/STRAP (MISCELLANEOUS) IMPLANT
IV NS IRRIG 3000ML ARTHROMATIC (IV SOLUTION) ×2 IMPLANT
KIT TURNOVER CYSTO (KITS) ×2 IMPLANT
LOOP MONOPOLAR YLW (ELECTROSURGICAL) IMPLANT
MANIFOLD NEPTUNE II (INSTRUMENTS) IMPLANT
PACK CYSTO (CUSTOM PROCEDURE TRAY) ×2 IMPLANT
PLUG CATH AND CAP STER (CATHETERS) IMPLANT
SYR 20ML LL LF (SYRINGE) ×2 IMPLANT
SYR TOOMEY IRRIG 70ML (MISCELLANEOUS)
SYRINGE TOOMEY IRRIG 70ML (MISCELLANEOUS) IMPLANT
TRACTIP FLEXIVA PULS ID 200XHI (Laser) IMPLANT
TRACTIP FLEXIVA PULSE ID 200 (Laser)
TUBE CONNECTING 12X1/4 (SUCTIONS) IMPLANT
WATER STERILE IRR 3000ML UROMA (IV SOLUTION) IMPLANT

## 2022-04-21 NOTE — Discharge Instructions (Signed)

## 2022-04-21 NOTE — Interval H&P Note (Signed)
History and Physical Interval Note:  04/21/2022 9:08 AM  Robin Mcdonald  has presented today for surgery, with the diagnosis of BLADDER TUMOR.  The various methods of treatment have been discussed with the patient and family. After consideration of risks, benefits and other options for treatment, the patient has consented to  Procedure(s) with comments: TRANSURETHRAL RESECTION OF BLADDER TUMOR (TURBT) (N/A) - 30 MINS CYSTOSCOPY WITH RETROGRADE PYELOGRAM (Bilateral) as a surgical intervention.  The patient's history has been reviewed, patient examined, no change in status, stable for surgery.  I have reviewed the patient's chart and labs.  Questions were answered to the patient's satisfaction.     Remi Haggard

## 2022-04-21 NOTE — Anesthesia Preprocedure Evaluation (Addendum)
Anesthesia Evaluation  Patient identified by MRN, date of birth, ID band Patient awake    Reviewed: Allergy & Precautions, NPO status , Patient's Chart, lab work & pertinent test results  Airway Mallampati: II  TM Distance: >3 FB Neck ROM: Full    Dental no notable dental hx.    Pulmonary asthma    Pulmonary exam normal        Cardiovascular hypertension, Pt. on medications  Rhythm:Regular Rate:Normal     Neuro/Psych negative neurological ROS  negative psych ROS   GI/Hepatic Neg liver ROS, hiatal hernia,GERD  Medicated,,  Endo/Other  Hypothyroidism    Renal/GU negative Renal ROS  negative genitourinary   Musculoskeletal  (+) Arthritis , Osteoarthritis,    Abdominal Normal abdominal exam  (+)   Peds  Hematology negative hematology ROS (+)   Anesthesia Other Findings   Reproductive/Obstetrics                             Anesthesia Physical Anesthesia Plan  ASA: 2  Anesthesia Plan: General   Post-op Pain Management:    Induction: Intravenous  PONV Risk Score and Plan: 3 and Ondansetron, Dexamethasone and Treatment may vary due to age or medical condition  Airway Management Planned: Mask and Oral ETT  Additional Equipment: None  Intra-op Plan:   Post-operative Plan: Extubation in OR  Informed Consent: I have reviewed the patients History and Physical, chart, labs and discussed the procedure including the risks, benefits and alternatives for the proposed anesthesia with the patient or authorized representative who has indicated his/her understanding and acceptance.     Dental advisory given  Plan Discussed with: CRNA  Anesthesia Plan Comments:        Anesthesia Quick Evaluation

## 2022-04-21 NOTE — Anesthesia Postprocedure Evaluation (Signed)
Anesthesia Post Note  Patient: Lailee Hoelzel  Procedure(s) Performed: TRANSURETHRAL RESECTION OF BLADDER TUMOR (TURBT) (Bladder) CYSTOSCOPY WITH RETROGRADE PYELOGRAM (Bilateral: Bladder)     Patient location during evaluation: PACU Anesthesia Type: General Level of consciousness: awake and alert Pain management: pain level controlled Vital Signs Assessment: post-procedure vital signs reviewed and stable Respiratory status: spontaneous breathing, nonlabored ventilation, respiratory function stable and patient connected to nasal cannula oxygen Cardiovascular status: blood pressure returned to baseline and stable Postop Assessment: no apparent nausea or vomiting Anesthetic complications: no   No notable events documented.  Last Vitals:  Vitals:   04/21/22 1206 04/21/22 1248  BP: 127/67 125/67  Pulse: 75 73  Resp: 15 20  Temp: (!) 30 C   SpO2: 97% 96%    Last Pain:  Vitals:   04/21/22 1228  TempSrc:   PainSc: 5                  Infantof Villagomez P Hanford Lust

## 2022-04-21 NOTE — Anesthesia Procedure Notes (Signed)
Procedure Name: LMA Insertion Date/Time: 04/21/2022 10:55 AM  Performed by: Mechele Claude, CRNAPre-anesthesia Checklist: Patient identified, Emergency Drugs available, Suction available and Patient being monitored Patient Re-evaluated:Patient Re-evaluated prior to induction Oxygen Delivery Method: Circle system utilized Preoxygenation: Pre-oxygenation with 100% oxygen Induction Type: IV induction Ventilation: Mask ventilation without difficulty LMA: LMA inserted LMA Size: 3.0 Number of attempts: 1 Airway Equipment and Method: Bite block Placement Confirmation: positive ETCO2 Tube secured with: Tape Dental Injury: Teeth and Oropharynx as per pre-operative assessment

## 2022-04-21 NOTE — Op Note (Signed)
Preoperative diagnosis:  1.  Bladder lesion history of hematuria  Postoperative diagnosis: 1.  Same  Procedure(s): 1.  Cystoscopy, bilateral retrograde pyelogram with intraoperative interpretation, there is resection of bladder tumor (small)  Surgeon: Dr. Harold Barban  Anesthesia: General  Complications: None  EBL: Minimal  Specimens: Bladder lesion/tumor  Disposition of specimens: To pathology  Intraoperative findings: Approximate 2 cm slightly raised erythematous area left posterior bladder wall.  Area completely removed utilizing rigid biopsy forceps and base fulgurated.  Bilateral retrogrades were normal.  Indication: 71 year old white female with history of microhematuria and cystoscopic findings of slightly raised erythematous area left posterior bladder wall.  Presents this time to go cystoscopy and bilateral retrogrades and transurethral section of bladder tumor/bladder lesion.  Description of procedure:  After obtaining informed consent the nature taken major cystoscopy suite placed under general anesthesia.  Placed in dorsolithotomy position genitalia prepped draped in usual sterile fashion.  Proper pause timeout performed.  73 Fransico was placed in the bladder without difficulty with above-noted findings.  Both ureteral orifices were normal position with flux of clear urine.  5 French open tip catheter utilized to cannulate both ureteral orifice ease and retrograde pyelograms were performed revealing normal filling of both upper urinary tract without evidence of filling defect or hydronephrosis.  Both upper tracts emptied out promptly upon removal of the retrograde catheter.  Attention then directed towards removal of the bladder lesion.  In order to preserve pathologic integrity was felt that this area decimating utilizing rigid biopsy forceps.  With a couple bites utilizing the rigid biopsy forceps the complete area was removed and the surrounding area and base of the  lesion was cauterized with Bugbee electrode with good hemostasis noted.  Bladder was emptied and the area reinspected with good hemostasis.  Procedure terminated she was awake from anesthesia taken back to recovery in stable condition.  No immediate complication from the procedure.

## 2022-04-21 NOTE — Transfer of Care (Signed)
Immediate Anesthesia Transfer of Care Note  Patient: Nyeemah Jennette  Procedure(s) Performed: Procedure(s) (LRB): TRANSURETHRAL RESECTION OF BLADDER TUMOR (TURBT) (N/A) CYSTOSCOPY WITH RETROGRADE PYELOGRAM (Bilateral)  Patient Location: PACU  Anesthesia Type: General  Level of Consciousness: awake, alert  and oriented  Airway & Oxygen Therapy: Patient Spontanous Breathing and Patient connected to face mask oxygen  Post-op Assessment: Report given to PACU RN and Post -op Vital signs reviewed and stable  Post vital signs: Reviewed and stable  Complications: No apparent anesthesia complications Last Vitals:  Vitals Value Taken Time  BP 131/64 04/21/22 1136  Temp 36.7 C 04/21/22 1136  Pulse 84 04/21/22 1145  Resp 17 04/21/22 1145  SpO2 95 % 04/21/22 1145  Vitals shown include unvalidated device data.  Last Pain:  Vitals:   04/21/22 1136  TempSrc:   PainSc: 0-No pain      Patients Stated Pain Goal: 5 (41/42/39 [id number])  Complications: No notable events documented.

## 2022-04-22 ENCOUNTER — Encounter (HOSPITAL_BASED_OUTPATIENT_CLINIC_OR_DEPARTMENT_OTHER): Payer: Self-pay | Admitting: Urology

## 2022-04-22 LAB — SURGICAL PATHOLOGY

## 2022-04-29 DIAGNOSIS — M25521 Pain in right elbow: Secondary | ICD-10-CM | POA: Diagnosis not present

## 2022-04-29 DIAGNOSIS — R310 Gross hematuria: Secondary | ICD-10-CM | POA: Diagnosis not present

## 2022-04-29 DIAGNOSIS — D414 Neoplasm of uncertain behavior of bladder: Secondary | ICD-10-CM | POA: Diagnosis not present

## 2022-05-15 IMAGING — MG MM DIGITAL SCREENING BILAT W/ TOMO AND CAD
8 series · 9 of 24 positions shown · non-contrast
Comparison: Previous exam(s).

CLINICAL DATA: Screening.

EXAM:
DIGITAL SCREENING BILATERAL MAMMOGRAM WITH TOMOSYNTHESIS AND CAD
TECHNIQUE: Bilateral screening digital craniocaudal and mediolateral oblique
mammograms were obtained. Bilateral screening digital breast
tomosynthesis was performed. The images were evaluated with
computer-aided detection.

[R CC synth-2D]
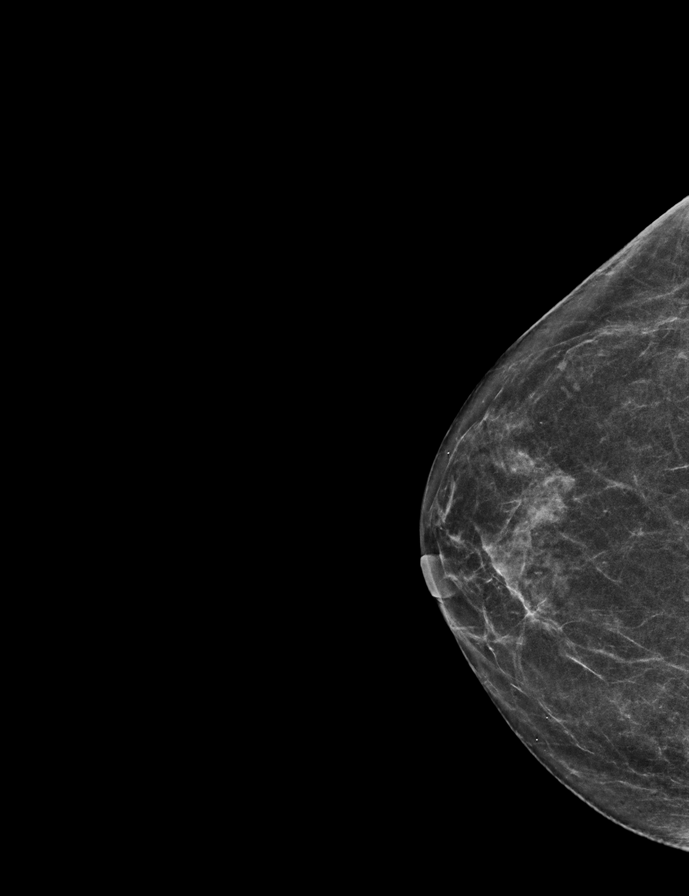

[L MLO synth-2D]
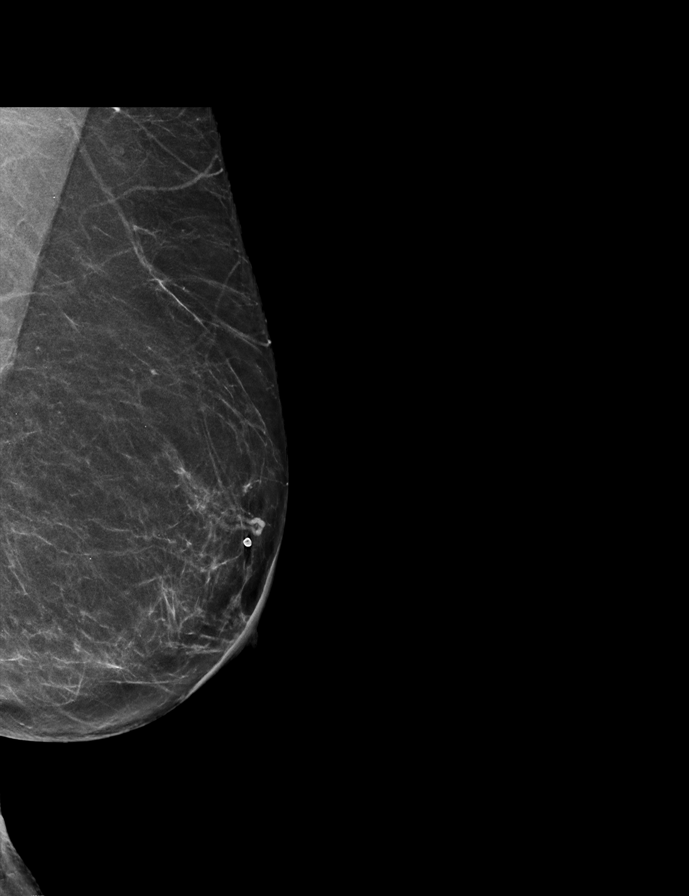

[R MLO synth-2D]
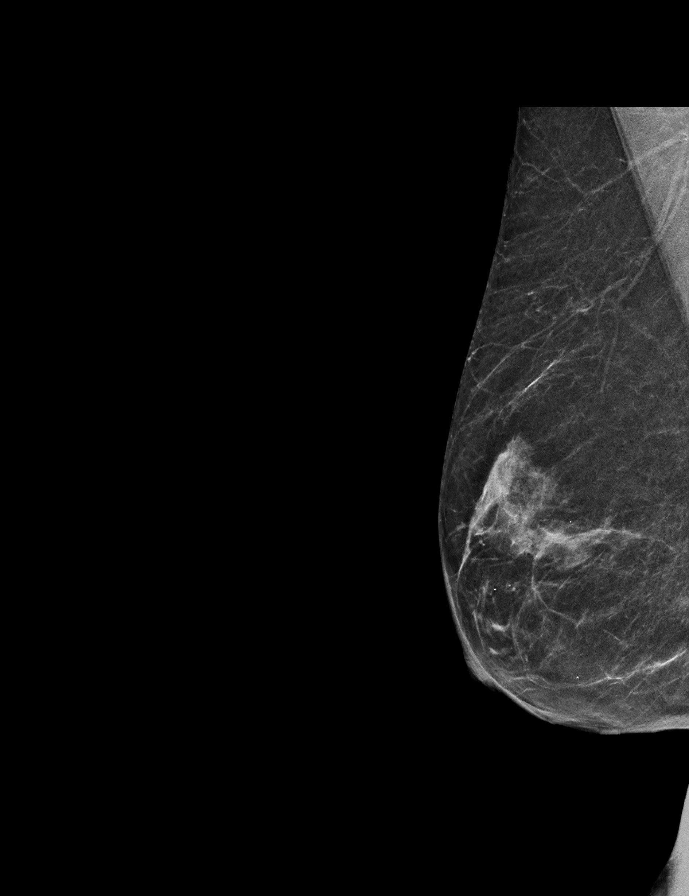

[L CC synth-2D]
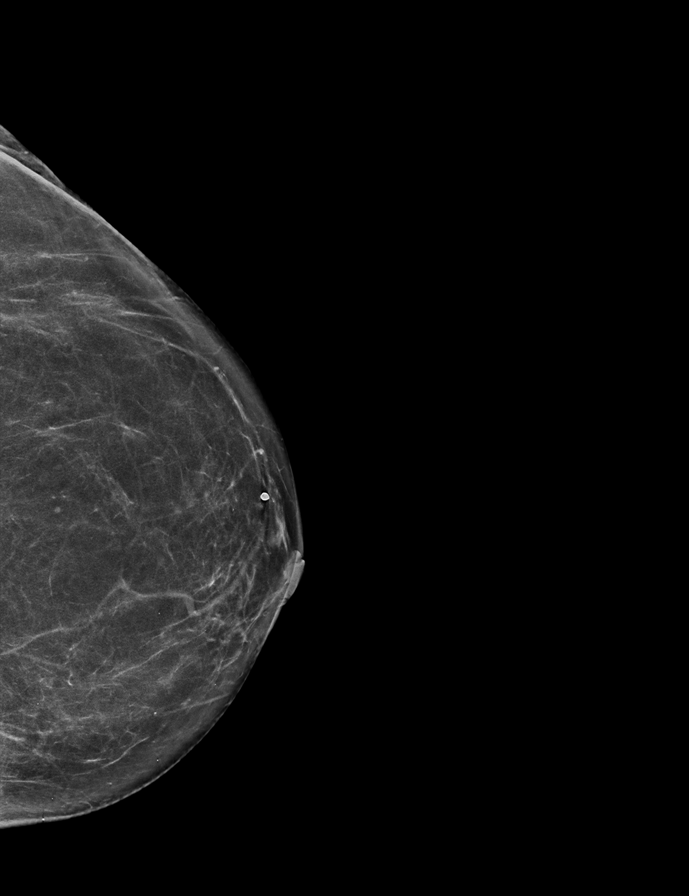

[R CC tomo · 2 of 53 frames shown]
[frame 18/53]
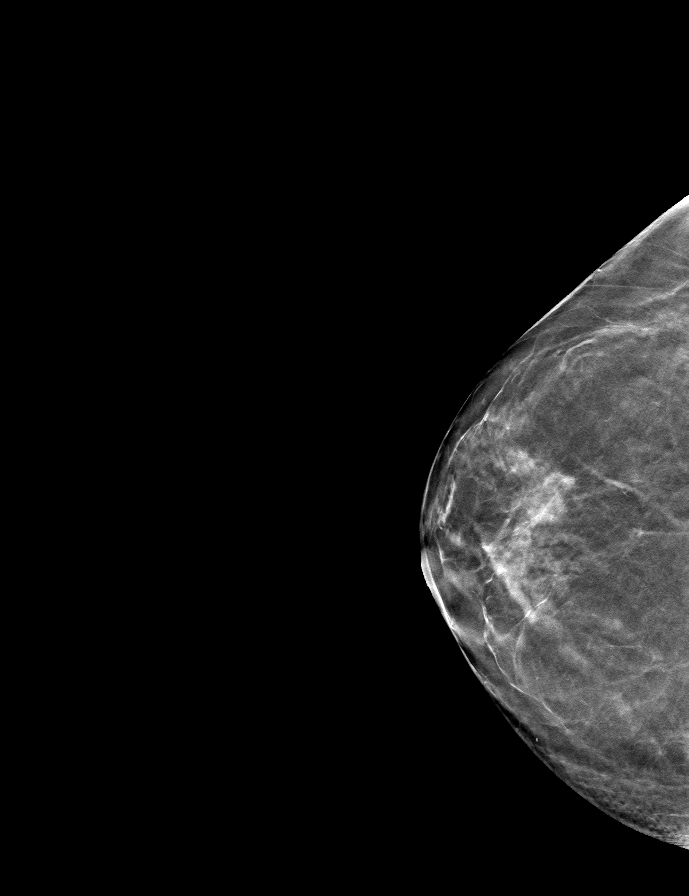
[frame 27/53]
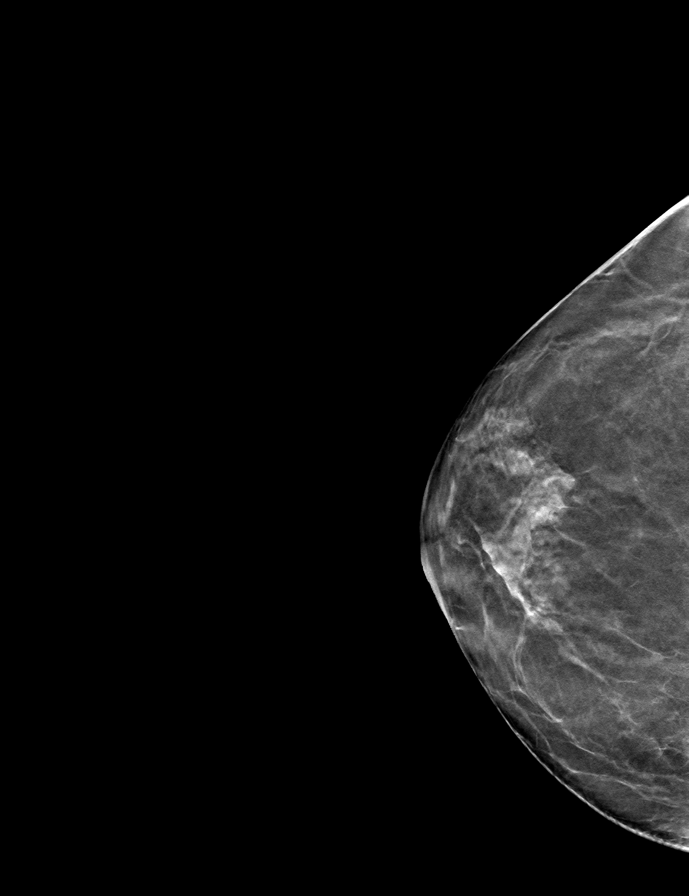

[L CC tomo · tomo slice 31/61.0]
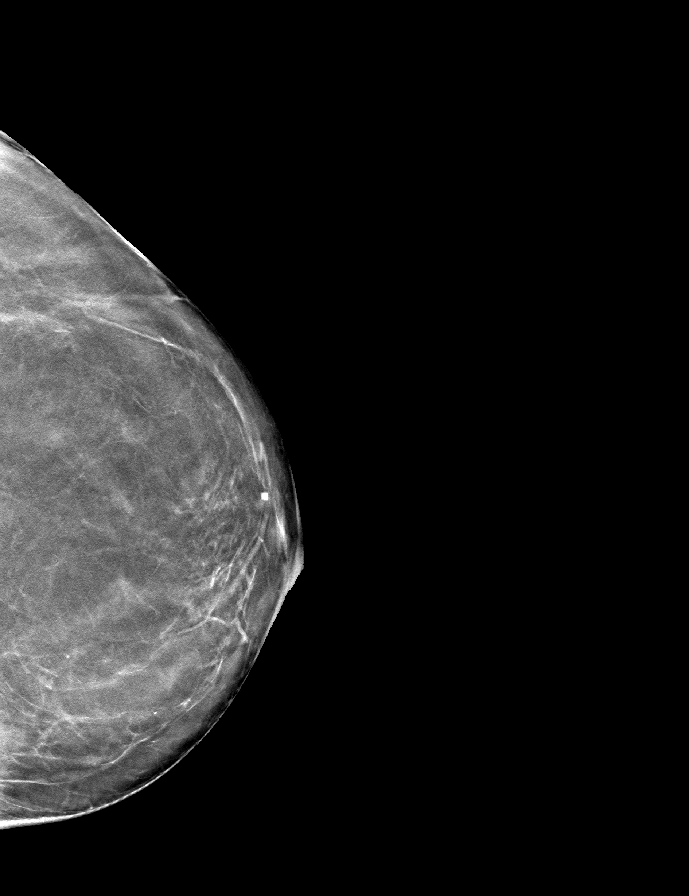

[R MLO tomo · tomo slice 29/57.0]
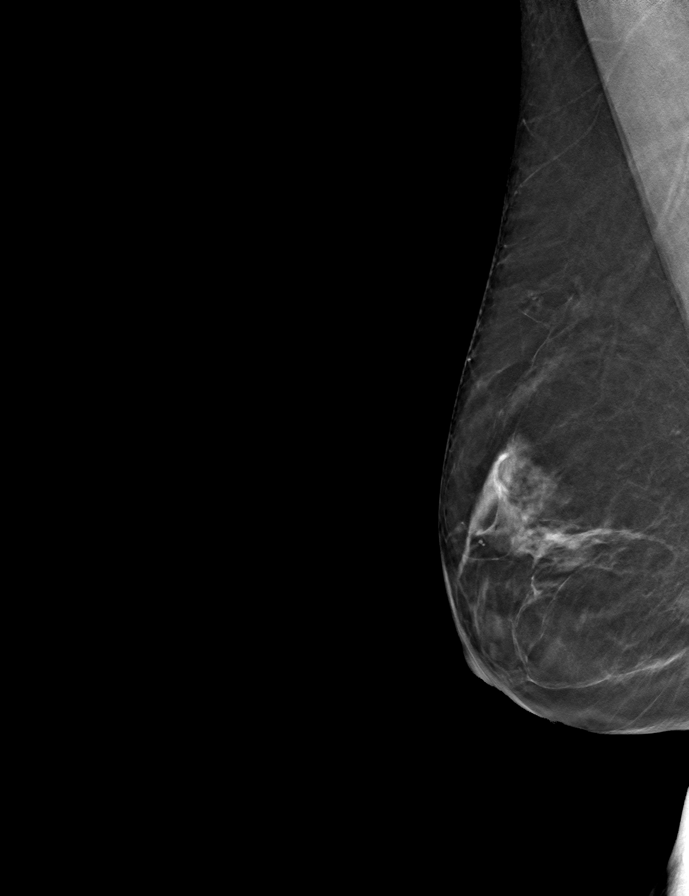

[L MLO tomo · tomo slice 31/61.0]
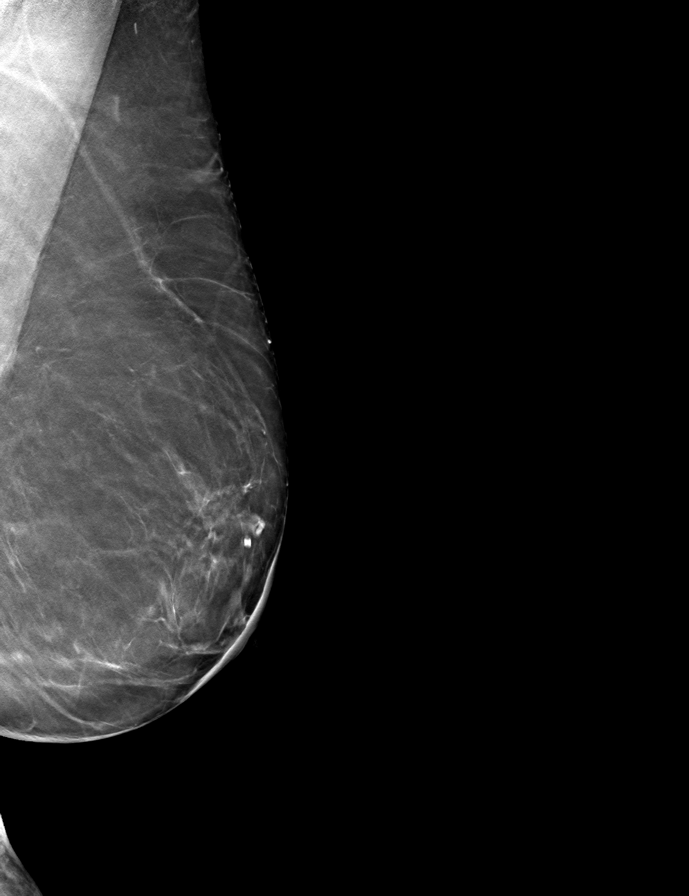

[9 of 24 positions shown; findings below may reference images not displayed]

ACR Breast Density Category b: There are scattered areas of
fibroglandular density.
FINDINGS: There are no findings suspicious for malignancy.
IMPRESSION: No mammographic evidence of malignancy. A result letter of this
screening mammogram will be mailed directly to the patient.

RECOMMENDATION:
Screening mammogram in one year. (Code:51-O-LD2)

BI-RADS CATEGORY  1: Negative.

## 2022-08-04 ENCOUNTER — Encounter: Payer: Self-pay | Admitting: Dermatology

## 2022-08-04 ENCOUNTER — Ambulatory Visit: Payer: Medicare Other | Admitting: Dermatology

## 2022-08-04 DIAGNOSIS — L578 Other skin changes due to chronic exposure to nonionizing radiation: Secondary | ICD-10-CM

## 2022-08-04 DIAGNOSIS — D1801 Hemangioma of skin and subcutaneous tissue: Secondary | ICD-10-CM | POA: Diagnosis not present

## 2022-08-04 DIAGNOSIS — W908XXA Exposure to other nonionizing radiation, initial encounter: Secondary | ICD-10-CM

## 2022-08-04 DIAGNOSIS — X32XXXA Exposure to sunlight, initial encounter: Secondary | ICD-10-CM

## 2022-08-04 DIAGNOSIS — L814 Other melanin hyperpigmentation: Secondary | ICD-10-CM

## 2022-08-04 DIAGNOSIS — Z1283 Encounter for screening for malignant neoplasm of skin: Secondary | ICD-10-CM | POA: Diagnosis not present

## 2022-08-04 DIAGNOSIS — D489 Neoplasm of uncertain behavior, unspecified: Secondary | ICD-10-CM

## 2022-08-04 DIAGNOSIS — C44729 Squamous cell carcinoma of skin of left lower limb, including hip: Secondary | ICD-10-CM

## 2022-08-04 DIAGNOSIS — L821 Other seborrheic keratosis: Secondary | ICD-10-CM

## 2022-08-04 NOTE — Patient Instructions (Addendum)
Cryotherapy Aftercare  Wash gently with soap and water everyday.   Apply Vaseline and Band-Aid daily until healed.    Patient Handout: Wound Care for Skin Biopsy Site  Patient Handout: Wound Care for Skin Biopsy Site  Taking Care of Your Skin Biopsy Site  Proper care of the biopsy site is essential for promoting healing and minimizing scarring. This handout provides instructions on how to care for your biopsy site to ensure optimal recovery.  1. Cleaning the Wound:  Clean the biopsy site daily with gentle soap and water. Gently pat the area dry with a clean, soft towel. Avoid harsh scrubbing or rubbing the area, as this can irritate the skin and delay healing.  2. Applying Aquaphor and Bandage:  After cleaning the wound, apply a thin layer of Aquaphor ointment to the biopsy site. Cover the area with a sterile bandage to protect it from dirt, bacteria, and friction. Change the bandage daily or as needed if it becomes soiled or wet.  3. Continued Care for One Week:  Repeat the cleaning, Aquaphor application, and bandaging process daily for one week following the biopsy procedure. Keeping the wound clean and moist during this initial healing period will help prevent infection and promote optimal healing.  4. Massaging Aquaphor into the Area:  ---After one week, discontinue the use of bandages but continue to apply Aquaphor to the biopsy site. ----Gently massage the Aquaphor into the area using circular motions. ---Massaging the skin helps to promote circulation and prevent the formation of scar tissue.   Additional Tips:  Avoid exposing the biopsy site to direct sunlight during the healing process, as this can cause hyperpigmentation or worsen scarring. If you experience any signs of infection, such as increased redness, swelling, warmth, or drainage from the wound, contact your healthcare provider immediately. Follow any additional instructions provided by your healthcare  provider for caring for the biopsy site and managing any discomfort. Conclusion:  Taking proper care of your skin biopsy site is crucial for ensuring optimal healing and minimizing scarring. By following these instructions for cleaning, applying Aquaphor, and massaging the area, you can promote a smooth and successful recovery. If you have any questions or concerns about caring for your biopsy site, don't hesitate to contact your healthcare provider for guidance.   Due to recent changes in healthcare laws, you may see results of your pathology and/or laboratory studies on MyChart before the doctors have had a chance to review them. We understand that in some cases there may be results that are confusing or concerning to you. Please understand that not all results are received at the same time and often the doctors may need to interpret multiple results in order to provide you with the best plan of care or course of treatment. Therefore, we ask that you please give us 2 business days to thoroughly review all your results before contacting the office for clarification. Should we see a critical lab result, you will be contacted sooner.   If You Need Anything After Your Visit  If you have any questions or concerns for your doctor, please call our main line at [phone] If no one answers, please leave a voicemail as directed and we will return your call as soon as possible. Messages left after 4 pm will be answered the following business day.   You may also send us a message via MyChart. We typically respond to MyChart messages within 1-2 business days.  For prescription refills, please ask your pharmacy to   contact our office. Our fax number is [phone].  If you have an urgent issue when the clinic is closed that cannot wait until the next business day, you can page your doctor at the number below.    Please note that while we do our best to be available for urgent issues outside of office hours,  we are not available 24/7.   If you have an urgent issue and are unable to reach us, you may choose to seek medical care at your doctor's office, retail clinic, urgent care center, or emergency room.  If you have a medical emergency, please immediately call 911 or go to the emergency department. In the event of inclement weather, please call our main line at [phone] for an update on the status of any delays or closures.  Dermatology Medication Tips: Please keep the boxes that topical medications come in in order to help keep track of the instructions about where and how to use these. Pharmacies typically print the medication instructions only on the boxes and not directly on the medication tubes.   If your medication is too expensive, please contact our office at [phone] or send us a message through MyChart.   We are unable to tell what your co-pay for medications will be in advance as this is different depending on your insurance coverage. However, we may be able to find a substitute medication at lower cost or fill out paperwork to get insurance to cover a needed medication.   If a prior authorization is required to get your medication covered by your insurance company, please allow us 1-2 business days to complete this process.  Drug prices often vary depending on where the prescription is filled and some pharmacies may offer cheaper prices.  The website www.goodrx.com contains coupons for medications through different pharmacies. The prices here do not account for what the cost may be with help from insurance (it may be cheaper with your insurance), but the website can give you the price if you did not use any insurance.  - You can print the associated coupon and take it with your prescription to the pharmacy.  - You may also stop by our office during regular business hours and pick up a GoodRx coupon card.  - If you need your prescription sent electronically to a different  pharmacy, notify our office through [place] MyChart or by phone at [phone]     

## 2022-08-04 NOTE — Progress Notes (Unsigned)
   New Patient Visit   Subjective  Robin Mcdonald is a 72 y.o. female who presents for the following: Skin Cancer Screening and Full Body Skin Exam  The patient presents for Total-Body Skin Exam (TBSE) for skin cancer screening and mole check. The patient has spots, moles and lesions to be evaluated, some may be new or changing and the patient has concerns that these could be cancer.  Special place of concern is on leg. Cut leg shaving a few weeks ago but has not fully healed. Father has had benign places removed. Patient had a SCC removed on right arm in January.   Concerns of a bilateral infection on toes.   The following portions of the chart were reviewed this encounter and updated as appropriate: medications, allergies, medical history  Review of Systems:  No other skin or systemic complaints except as noted in HPI or Assessment and Plan.  Objective  Well appearing patient in no apparent distress; mood and affect are within normal limits.  A full examination was performed including scalp, head, eyes, ears, nose, lips, neck, chest, axillae, abdomen, back, buttocks, bilateral upper extremities, bilateral lower extremities, hands, feet, fingers, toes, fingernails, and toenails. All findings within normal limits unless otherwise noted below.   Relevant physical exam findings are noted in the Assessment and Plan.  Left Lower Leg - Anterior Tender crusted nodule     Assessment & Plan   LENTIGINES, SEBORRHEIC KERATOSES, HEMANGIOMAS - Benign normal skin lesions - Benign-appearing - Call for any changes  MELANOCYTIC NEVI - Tan-brown and/or pink-flesh-colored symmetric macules and papules - Benign appearing on exam today - Observation - Call clinic for new or changing moles - Recommend daily use of broad spectrum spf 30+ sunscreen to sun-exposed areas.   ACTINIC DAMAGE - Chronic condition, secondary to cumulative UV/sun exposure - diffuse scaly erythematous macules with  underlying dyspigmentation - Recommend daily broad spectrum sunscreen SPF 30+ to sun-exposed areas, reapply every 2 hours as needed.  - Staying in the shade or wearing long sleeves, sun glasses (UVA+UVB protection) and wide brim hats (4-inch brim around the entire circumference of the hat) are also recommended for sun protection.  - Call for new or changing lesions.  SKIN CANCER SCREENING PERFORMED TODAY.    Neoplasm of uncertain behavior Left Lower Leg - Anterior  Skin / nail biopsy Type of biopsy: tangential   Informed consent: discussed and consent obtained   Timeout: patient name, date of birth, surgical site, and procedure verified   Procedure prep:  Patient was prepped and draped in usual sterile fashion Prep type:  Isopropyl alcohol Anesthesia: the lesion was anesthetized in a standard fashion   Anesthetic:  1% lidocaine w/ epinephrine 1-100,000 buffered w/ 8.4% NaHCO3 Instrument used: DermaBlade   Hemostasis achieved with: aluminum chloride   Outcome: patient tolerated procedure well   Post-procedure details: sterile dressing applied and wound care instructions given   Dressing type: petrolatum and bandage      Return in about 1 year (around 08/04/2023) for full body skin exam.    Documentation: I have reviewed the above documentation for accuracy and completeness, and I agree with the above.  Ellard Artis, MD  I, Zigmund Gottron, CMA, am acting as scribe for Ellard Artis, MD.

## 2022-08-12 ENCOUNTER — Telehealth: Payer: Self-pay | Admitting: Dermatology

## 2022-08-12 NOTE — Progress Notes (Signed)
Please call pt and notify their bx results were positive for a skin CA that will be excised by Dr. Onalee Hua  Please schedule 30 min surgery

## 2022-08-12 NOTE — Telephone Encounter (Signed)
Please review biopsy results and follow up with instructions to give to the patient. Thank you!

## 2022-08-13 ENCOUNTER — Encounter: Payer: Self-pay | Admitting: Dermatology

## 2022-08-17 NOTE — Telephone Encounter (Signed)
Appointment scheduled.

## 2022-08-27 ENCOUNTER — Ambulatory Visit (INDEPENDENT_AMBULATORY_CARE_PROVIDER_SITE_OTHER): Payer: Medicare Other | Admitting: Dermatology

## 2022-08-27 DIAGNOSIS — C44729 Squamous cell carcinoma of skin of left lower limb, including hip: Secondary | ICD-10-CM

## 2022-08-27 DIAGNOSIS — C4492 Squamous cell carcinoma of skin, unspecified: Secondary | ICD-10-CM

## 2022-08-27 HISTORY — DX: Squamous cell carcinoma of skin, unspecified: C44.92

## 2022-08-27 MED ORDER — MUPIROCIN 2 % EX OINT: 1.0000 | TOPICAL_OINTMENT | Freq: Every day | CUTANEOUS | 0 refills | Status: DC

## 2022-08-27 NOTE — Progress Notes (Signed)
   Follow-Up Visit   Subjective  Robin Mcdonald is a 72 y.o. female who presents for the following: Excision of left lower leg  The following portions of the chart were reviewed this encounter and updated as appropriate: medications, allergies, medical history  Review of Systems:  No other skin or systemic complaints except as noted in HPI or Assessment and Plan.  Objective  Well appearing patient in no apparent distress; mood and affect are within normal limits.  A focused examination was performed of the following areas: left lower leg  Relevant physical exam findings are noted in the Assessment and Plan.   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 With dressing changes  Skin excision  Lesion length (cm):  0.8 Lesion width (cm):  0.8 Margin per side (cm):  0.4 Total excision diameter (cm):  1.6 Informed consent: discussed and consent obtained   Timeout: patient name, date of birth, surgical site, and procedure verified   Procedure prep:  Patient was prepped and draped in usual sterile fashion Prep type:  Isopropyl alcohol Anesthesia: the lesion was anesthetized in a standard fashion   Anesthetic:  1% lidocaine w/ epinephrine 1-100,000 buffered w/ 8.4% NaHCO3 Instrument used: #15 blade   Hemostasis achieved with: suture, pressure and electrodesiccation   Outcome: patient tolerated procedure well with no complications    Skin repair Complexity:  Complex Final length (cm):  4.2 Informed consent: discussed and consent obtained   Reason for type of repair: reduce tension to allow closure, reduce the risk of dehiscence, infection, and necrosis, reduce subcutaneous dead space and avoid a hematoma, allow closure of the large defect, preserve normal anatomy, preserve normal anatomical and functional relationships and  enhance both functionality and cosmetic results   Undermining: area extensively undermined   Undermining comment:  Undermining defect 1.0cm Subcutaneous layers (deep stitches):  Suture size:  4-0 Suture type: Vicryl (polyglactin 910)   Subcutaneous suture technique: inverted dermal. Fine/surface layer approximation (top stitches):  Suture size:  4-0 Suture type: nylon   Stitches: vertical mattress and simple running   Suture removal (days):  14 Hemostasis achieved with: pressure and electrodesiccation Outcome: patient tolerated procedure well with no complications   Post-procedure details: sterile dressing applied and wound care instructions given   Dressing type: pressure dressing and bandage (Mupirocin)    Specimen 1 - Surgical pathology Differential Diagnosis: Biopsy proven SCC  Check Margins: Yes  [id number]     No follow-ups on file.    Documentation: I have reviewed the above documentation for accuracy and completeness, and I agree with the above.  Langston Reusing, MD

## 2022-08-27 NOTE — Patient Instructions (Addendum)
Topical steroids (such as triamcinolone, fluocinolone, fluocinonide, mometasone, clobetasol, halobetasol, betamethasone, hydrocortisone) can cause thinning and lightening of the skin if they are used for too long in the same area. Your physician has selected the right strength medicine for your problem and area affected on the body. Please use your medication only as directed by your physician to prevent side effects.  Wound Care Instructions  On the day following your surgery, you should begin doing daily dressing changes: Remove the old dressing and discard it. Cleanse the wound gently with tap water. This may be done in the shower or by placing a wet gauze pad directly on the wound and letting it soak for several minutes. It is important to gently remove any dried blood from the wound in order to encourage healing. This may be done by gently rolling a moistened Q-tip on the dried blood. Do not pick at the wound. If the wound should start to bleed, continue cleaning the wound, then place a moist gauze pad on the wound and hold pressure for a few minutes.  Make sure you then dry the skin surrounding the wound completely or the tape will not stick to the skin. Do not use cotton balls on the wound. After the wound is clean and dry, apply the ointment gently with a Q-tip. Cut a non-stick pad to fit the size of the wound. Lay the pad flush to the wound. If the wound is draining, you may want to reinforce it with a small amount of gauze on top of the non-stick pad for a little added compression to the area. Use the tape to seal the area completely. Select from the following with respect to your individual situation: If your wound has been stitched closed: continue the above steps 1-8 at least daily until your sutures are removed. If your wound has been left open to heal: continue steps 1-8 at least daily for the first 3-4 weeks. We would like for you to take a few extra precautions for at least the next  week. Sleep with your head elevated on pillows if our wound is on your head. Do not bend over or lift heavy items to reduce the chance of elevated blood pressure to the wound Do not participate in particularly strenuous activities.   Below is a list of dressing supplies you might need.  Cotton-tipped applicators - Q-tips Gauze pads (2x2 and/or 4x4) - All-Purpose Sponges Non-stick dressing material - Telfa Tape - Paper or Hypafix New and clean tube of petroleum jelly - Vaseline    Comments on Post-Operative Period Slight swelling and redness often appear around the wound. This is normal and will disappear within several days following the surgery. The healing wound will drain a brownish-red-yellow discharge during healing. This is a normal phase of wound healing. As the wound begins to heal, the drainage may increase in amount. Again, this drainage is normal. Notify us if the drainage becomes persistently bloody, excessively swollen, or intensely painful or develops a foul odor or red streaks.  If you should experience mild discomfort during the healing phase, you may take an aspirin-free medication such as Tylenol (acetaminophen). Notify us if the discomfort is severe or persistent. Avoid alcoholic beverages when taking pain medicine.  In Case of Wound Hemorrhage A wound hemorrhage is when the bandage suddenly becomes soaked with bright red blood and flows profusely. If this happens, sit down or lie down with your head elevated. If the wound has a dressing on it, do  not remove the dressing. Apply pressure to the existing gauze. If the wound is not covered, use a gauze pad to apply pressure and continue applying the pressure for 20 minutes without peeking. DO NOT COVER THE WOUND WITH A LARGE TOWEL OR WASH CLOTH. Release your hand from the wound site but do not remove the dressing. If the bleeding has stopped, gently clean around the wound. Leave the dressing in place for 24 hours if possible.  This wait time allows the blood vessels to close off so that you do not spark a new round of bleeding by disrupting the newly clotted blood vessels with an immediate dressing change. If the bleeding does not subside, continue to hold pressure. If matters are out of your control, contact an After Hours clinic or go to the Emergency Room.  Due to recent changes in healthcare laws, you may see results of your pathology and/or laboratory studies on MyChart before the doctors have had a chance to review them. We understand that in some cases there may be results that are confusing or concerning to you. Please understand that not all results are received at the same time and often the doctors may need to interpret multiple results in order to provide you with the best plan of care or course of treatment. Therefore, we ask that you please give [place] 2 business days to thoroughly review all your results before contacting the office for clarification. Should we see a critical lab result, you will be contacted sooner.   If You Need Anything After Your Visit  If you have any questions or concerns for your doctor, please call our main line at [phone] If no one answers, please leave a voicemail as directed and we will return your call as soon as possible. Messages left after 4 pm will be answered the following business day.   You may also send [place] a message via MyChart. We typically respond to MyChart messages within 1-2 business days.  For prescription refills, please ask your pharmacy to contact our office. Our fax number is [phone].  If you have an urgent issue when the clinic is closed that cannot wait until the next business day, you can page your doctor at the number below.    Please note that while we do our best to be available for urgent issues outside of office hours, we are not available 24/7.   If you have an urgent issue and are unable to reach [place], you may choose to seek medical care at your  doctor's office, retail clinic, urgent care center, or emergency room.  If you have a medical emergency, please immediately call 911 or go to the emergency department. In the event of inclement weather, please call our main line at [phone] for an update on the status of any delays or closures.  Dermatology Medication Tips: Please keep the boxes that topical medications come in in order to help keep track of the instructions about where and how to use these. Pharmacies typically print the medication instructions only on the boxes and not directly on the medication tubes.   If your medication is too expensive, please contact our office at [phone] or send [place] a message through MyChart.   We are unable to tell what your co-pay for medications will be in advance as this is different depending on your insurance coverage. However, we may be able to find a substitute medication at lower cost or fill out paperwork to get insurance to cover  a needed medication.   If a prior authorization is required to get your medication covered by your insurance company, please allow [place] 1-2 business days to complete this process.  Drug prices often vary depending on where the prescription is filled and some pharmacies may offer cheaper prices.  The website www.goodrx.com contains coupons for medications through different pharmacies. The prices here do not account for what the cost may be with help from insurance (it may be cheaper with your insurance), but the website can give you the price if you did not use any insurance.  - You can print the associated coupon and take it with your prescription to the pharmacy.  - You may also stop by our office during regular business hours and pick up a GoodRx coupon card.  - If you need your prescription sent electronically to a different pharmacy, notify our office through [facility] or by phone at [phone]

## 2022-09-01 ENCOUNTER — Encounter: Payer: Self-pay | Admitting: Dermatology

## 2022-09-07 NOTE — Progress Notes (Signed)
Hi Michele,  Margins clear from SE of SCC.  Please update pathology log as complete if not already done so.  Thanks.  -Dr. Onalee Hua

## 2022-09-10 ENCOUNTER — Ambulatory Visit (INDEPENDENT_AMBULATORY_CARE_PROVIDER_SITE_OTHER): Payer: Medicare Other | Admitting: Dermatology

## 2022-09-10 ENCOUNTER — Encounter: Payer: Self-pay | Admitting: Dermatology

## 2022-09-10 DIAGNOSIS — K529 Noninfective gastroenteritis and colitis, unspecified: Secondary | ICD-10-CM

## 2022-09-10 DIAGNOSIS — L905 Scar conditions and fibrosis of skin: Secondary | ICD-10-CM

## 2022-09-10 HISTORY — DX: Noninfective gastroenteritis and colitis, unspecified: K52.9

## 2022-09-10 NOTE — Progress Notes (Signed)
   Follow-Up Visit   Subjective  [name] is a 72 y.o. 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  Objective  Well appearing patient in no apparent distress; mood and affect are within normal limits.  Areas Examined: Left lower anterior leg Relevant physical exam findings are noted in the Assessment and Plan.    Assessment & Plan    Encounter for Removal of Sutures - Incision site is clean, dry and intact. - Wound cleansed, sutures removed, wound cleansed   - Discussed pathology results showing Skin (M), left lower leg anterior RESIDUAL SQUAMOUS CELL CARCINOMA IN SITU, MARGINS FREE - Patient advised to keep steri-strips dry until they fall off. - Scars remodel for a full year. - Once steri-strips fall off, patient can apply over-the-counter silicone scar cream once to twice a day to help with scar remodeling if desired. - Patient advised to call with any concerns or if they notice any new or changing lesions.  No follow-ups on file.  Thressa Sheller Rylann Munford

## 2022-09-10 NOTE — Patient Instructions (Addendum)
Massage area daily with ointment. Cover as needed.    Due to recent changes in healthcare laws, you may see results of your pathology and/or laboratory studies on MyChart before the doctors have had a chance to review them. We understand that in some cases there may be results that are confusing or concerning to you. Please understand that not all results are received at the same time and often the doctors may need to interpret multiple results in order to provide you with the best plan of care or course of treatment. Therefore, we ask that you please give [place] 2 business days to thoroughly review all your results before contacting the office for clarification. Should we see a critical lab result, you will be contacted sooner.   If You Need Anything After Your Visit  If you have any questions or concerns for your doctor, please call our main line at [phone] If no one answers, please leave a voicemail as directed and we will return your call as soon as possible. Messages left after 4 pm will be answered the following business day.   You may also send [place] a message via MyChart. We typically respond to MyChart messages within 1-2 business days.  For prescription refills, please ask your pharmacy to contact our office. Our fax number is [phone].  If you have an urgent issue when the clinic is closed that cannot wait until the next business day, you can page your doctor at the number below.    Please note that while we do our best to be available for urgent issues outside of office hours, we are not available 24/7.   If you have an urgent issue and are unable to reach [place], you may choose to seek medical care at your doctor's office, retail clinic, urgent care center, or emergency room.  If you have a medical emergency, please immediately call 911 or go to the emergency department. In the event of inclement weather, please call our main line at [phone] for an update on the status of any  delays or closures.  Dermatology Medication Tips: Please keep the boxes that topical medications come in in order to help keep track of the instructions about where and how to use these. Pharmacies typically print the medication instructions only on the boxes and not directly on the medication tubes.   If your medication is too expensive, please contact our office at [phone] or send [place] a message through MyChart.   We are unable to tell what your co-pay for medications will be in advance as this is different depending on your insurance coverage. However, we may be able to find a substitute medication at lower cost or fill out paperwork to get insurance to cover a needed medication.   If a prior authorization is required to get your medication covered by your insurance company, please allow [place] 1-2 business days to complete this process.  Drug prices often vary depending on where the prescription is filled and some pharmacies may offer cheaper prices.  The website www.goodrx.com contains coupons for medications through different pharmacies. The prices here do not account for what the cost may be with help from insurance (it may be cheaper with your insurance), but the website can give you the price if you did not use any insurance.  - You can print the associated coupon and take it with your prescription to the pharmacy.  - You may also stop by our office during regular business hours and pick  up a GoodRx coupon card.  - If you need your prescription sent electronically to a different pharmacy, notify our office through [facility] or by phone at [phone]

## 2022-09-14 NOTE — Progress Notes (Signed)
   Follow-Up Visit   Subjective  Robin Mcdonald is a 72 y.o. 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  Objective  Well appearing patient in no apparent distress; mood and affect are within normal limits.  Areas Examined: Left lower anterior leg Relevant physical exam findings are noted in the Assessment and Plan.  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 There are foci of full thickness keratinocytic atypia consistent with residual squamous cell carcinoma in situ. The margins are free of involvement.  Assessment & Plan    Encounter for Removal of Sutures - Incision site is clean, dry and intact. - Wound cleansed, sutures removed, wound cleansed   - Discussed pathology results showing- Patient advised to keep steri-strips dry until they fall off. - Scars remodel for a full year. - Once steri-strips fall off, patient can apply over-the-counter silicone scar cream once to twice a day to help with scar remodeling if desired. - Patient advised to call with any concerns or if they notice any new or changing lesions.  Cicatrix Exam: pink scar with approximated wound edges and mild hypertrophy   Treatment Plan: - Discussed daily massaging for 2-3 months with Aquaphor to promote optimal healing and prevent keloid or hypertrophic scaring.    Return in about 6 months (around 03/13/2023) for TBSE.  Langston Reusing

## 2022-11-06 ENCOUNTER — Other Ambulatory Visit (HOSPITAL_BASED_OUTPATIENT_CLINIC_OR_DEPARTMENT_OTHER): Payer: Self-pay | Admitting: Internal Medicine

## 2022-11-06 ENCOUNTER — Other Ambulatory Visit: Payer: Self-pay | Admitting: Internal Medicine

## 2022-11-06 DIAGNOSIS — Z780 Asymptomatic menopausal state: Secondary | ICD-10-CM

## 2022-11-06 DIAGNOSIS — E782 Mixed hyperlipidemia: Secondary | ICD-10-CM

## 2022-11-25 ENCOUNTER — Ambulatory Visit (INDEPENDENT_AMBULATORY_CARE_PROVIDER_SITE_OTHER): Payer: Medicare Other | Admitting: Dermatology

## 2022-11-25 ENCOUNTER — Encounter: Payer: Self-pay | Admitting: Dermatology

## 2022-11-25 VITALS — BP 117/76

## 2022-11-25 DIAGNOSIS — I781 Nevus, non-neoplastic: Secondary | ICD-10-CM | POA: Diagnosis not present

## 2022-11-25 DIAGNOSIS — M06341 Rheumatoid nodule, right hand: Secondary | ICD-10-CM | POA: Diagnosis not present

## 2022-11-25 DIAGNOSIS — M0630 Rheumatoid nodule, unspecified site: Secondary | ICD-10-CM

## 2022-11-25 DIAGNOSIS — I8393 Asymptomatic varicose veins of bilateral lower extremities: Secondary | ICD-10-CM | POA: Diagnosis not present

## 2022-11-25 NOTE — Progress Notes (Signed)
   Follow-Up Visit   Subjective  Robin Mcdonald is a 72 y.o. female who presents for the following: She has 2 knots on her right index finger present for about 3 months. One is more raised and is painful when hit or when she bends her finger. She also has a spot on her left lower leg that came up about 3 weeks ago and is painful if touched.   The following portions of the chart were reviewed this encounter and updated as appropriate: medications, allergies, medical history  Review of Systems:  No other skin or systemic complaints except as noted in HPI or Assessment and Plan.  Objective  Well appearing patient in no apparent distress; mood and affect are within normal limits.   A focused examination was performed of the following areas: Right hand, left leg   Relevant exam findings are noted in the Assessment and Plan.    Assessment & Plan   Probable Rheumatoid nodules  Exam: firm subcutaneous growths involving DIP joint on right index finger         Treatment Plan: We will check labs today: Rheumatoid Factor, CRP, ANA and Sed Rate.  Varicose Veins/Spider Veins - legs - Dilated blue, purple or red veins at the lower extremities - Reassured - Smaller vessels can be treated by sclerotherapy (a procedure to inject a medicine into the veins to make them disappear) if desired, but the treatment is not covered by insurance. Larger vessels may be covered if symptomatic and we would refer to vascular surgeon if treatment desired.     Rheumatoid nodule (HCC)  Related Procedures Rheumatoid Factor ANA Sedimentation Rate C-reactive Protein    Return for Follow up as scheduled.  I, Joanie Coddington, CMA, am acting as scribe for [employer], DO .   Documentation: I have reviewed the above documentation for accuracy and completeness, and I agree with the above.  Langston Reusing, DO

## 2022-11-25 NOTE — Patient Instructions (Signed)
Due to recent changes in healthcare laws, you may see results of your pathology and/or laboratory studies on MyChart before the doctors have had a chance to review them. We understand that in some cases there may be results that are confusing or concerning to you. Please understand that not all results are received at the same time and often the doctors may need to interpret multiple results in order to provide you with the best plan of care or course of treatment. Therefore, we ask that you please give us 2 business days to thoroughly review all your results before contacting the office for clarification. Should we see a critical lab result, you will be contacted sooner.   If You Need Anything After Your Visit  If you have any questions or concerns for your doctor, please call our main line at [phone] If no one answers, please leave a voicemail as directed and we will return your call as soon as possible. Messages left after 4 pm will be answered the following business day.   You may also send us a message via MyChart. We typically respond to MyChart messages within 1-2 business days.  For prescription refills, please ask your pharmacy to contact our office. Our fax number is [phone].  If you have an urgent issue when the clinic is closed that cannot wait until the next business day, you can page your doctor at the number below.    Please note that while we do our best to be available for urgent issues outside of office hours, we are not available 24/7.   If you have an urgent issue and are unable to reach us, you may choose to seek medical care at your doctor's office, retail clinic, urgent care center, or emergency room.  If you have a medical emergency, please immediately call 911 or go to the emergency department. In the event of inclement weather, please call our main line at [phone] for an update on the status of any delays or closures.  Dermatology Medication Tips: Please  keep the boxes that topical medications come in in order to help keep track of the instructions about where and how to use these. Pharmacies typically print the medication instructions only on the boxes and not directly on the medication tubes.   If your medication is too expensive, please contact our office at [phone] or send us a message through MyChart.   We are unable to tell what your co-pay for medications will be in advance as this is different depending on your insurance coverage. However, we may be able to find a substitute medication at lower cost or fill out paperwork to get insurance to cover a needed medication.   If a prior authorization is required to get your medication covered by your insurance company, please allow us 1-2 business days to complete this process.  Drug prices often vary depending on where the prescription is filled and some pharmacies may offer cheaper prices.  The website www.goodrx.com contains coupons for medications through different pharmacies. The prices here do not account for what the cost may be with help from insurance (it may be cheaper with your insurance), but the website can give you the price if you did not use any insurance.  - You can print the associated coupon and take it with your prescription to the pharmacy.  - You may also stop by our office during regular business hours and pick up a GoodRx coupon card.  - If you need your   prescription sent electronically to a different pharmacy, notify our office through [facility] MyChart or by phone at [phone]     

## 2022-11-26 ENCOUNTER — Ambulatory Visit (HOSPITAL_COMMUNITY)
Admission: RE | Admit: 2022-11-26 | Discharge: 2022-11-26 | Disposition: A | Discharge status: Home / Self Care | Payer: Medicare Other | Source: Ambulatory Visit | Attending: Internal Medicine | Admitting: Internal Medicine

## 2022-11-26 ENCOUNTER — Encounter (HOSPITAL_COMMUNITY): Payer: Self-pay

## 2022-11-26 DIAGNOSIS — E782 Mixed hyperlipidemia: Secondary | ICD-10-CM | POA: Insufficient documentation

## 2022-11-26 LAB — ANA: Anti Nuclear Antibody (ANA): NEGATIVE

## 2022-11-26 LAB — C-REACTIVE PROTEIN: CRP: 3 mg/L (ref 0–10)

## 2022-11-26 LAB — RHEUMATOID FACTOR: Rheumatoid fact SerPl-aCnc: 10.6 IU/mL (ref <14.0)

## 2022-11-26 LAB — SEDIMENTATION RATE: Sed Rate: 2 mm/hr (ref 0–40)

## 2022-12-06 NOTE — Progress Notes (Unsigned)
Cardiology Office Note:  .   Date:  12/09/2022  ID:  Robin Mcdonald, DOB January 27, 1951, MRN [id number] PCP: Thana Ates, MD  [facility] Providers Cardiologist:  Chilton Si, MD Cardiology APP:  Levi Aland, NP    Patient Profile: .      PMH: Dyslipidemia Statin intolerance: does not specifically recall which agents but it was many years ago Hypertension Coronary artery disease CAC 1420 (98th percentile) on CT 12/03/22 Family history Maternal grandmother - 3 heart attacks, 2 major, died after 2nd major MI Mother - some heart disease, PVD, no CAD known Father - high cholesterol        History of Present Illness: .   Robin Mcdonald is a very pleasant 72 y.o. female who is here today to establish care with cardiology due to elevated coronary artery calcium score. She reports she recently established care with Dr. Margaretann Loveless for primary care and was encouraged to undergo coronary calcium score due to calcium seen on previous CT. She reports she has occasional palpitations but no chest pain, shortness of breath, dyspnea, or other concerning symptoms. Admits she does not get much exercise. 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 Admits that her dietary weakness is white bread.  Reports she has been hiking fenofibrate for a long time.  Has a history of frequent leg cramps even when not on statin therapy.  She started long acting magnesium 1 to 2 months ago.  Diet: Grits or oatmeal, 1 cup coffee for breakfast, 1/2 sandwich and handful of chips for lunch, dinner varies - beef and chicken 1-2 x per week, seafood 1-2 x per week. Likes to grill. Water throughout the day. Uses olive oil.   Exercise: No regular exercise regimen  ASCVD risk: 12.2%  ROS: See HPI       Studies Reviewed: .         Risk Assessment/Calculations:             Physical Exam:   VS:  BP 130/73   Pulse 96   Ht 5\' 4"  (1.626 m)   Wt 126 lb (57.2 kg)   BMI 21.63  kg/m    Wt Readings from Last 3 Encounters:  12/09/22 126 lb (57.2 kg)  04/21/22 126 lb 4.8 oz (57.3 kg)  10/12/18 129 lb (58.5 kg)    GEN: Well nourished, well developed in no acute distress NECK: No JVD; No carotid bruits CARDIAC: RRR, 2/6 systolic murmur. No rubs, gallops RESPIRATORY:  Clear to auscultation without rales, wheezing or rhonchi  ABDOMEN: Soft, non-tender, non-distended EXTREMITIES:  No edema; No deformity     ASSESSMENT AND PLAN: .    Coronary artery disease: Coronary calcium score 1420 (98th percentile) with distribution of calcium across 4 main coronary arteries. She denies chest pain, dyspnea, or other symptoms concerning for angina. No indication for further ischemic evaluation at this time. We are starting statin therapy due to ASCVD risk score 12.2%. Will start rosuvastatin 20 mg daily. Secondary prevention emphasized, including 150 minutes of moderate intensity exercise each week, heart healthy diet low in saturated fat, cholesterol, sugar, and simple carbohydrates. Majority of diet should consist of lean protein, fruits, vegetables, and whole grains.  Dyslipidemia goal < 70: LDL 99, triglycerides 109, HDL 70 on 11/06/22. Lengthy discussion about long-term effects of elevated LDL. Goal for her is < 70, I suspect she can achieve this on moderate or high intensity statin along with dietary changes and  increased physical activity. We will discontinue fenofibrate as her triglycerides are stable. Start rosuvastatin 20 mg daily. Advised her to notify us if leg cramps worsen or she has other concerning side effects.   Murmur: 2/6 systolic murmur noted on exam in the RUSB. She is not aware of past history of murmur. We will get echocardiogram to evaluate for structural heart disease  Hypertension: BP is well controlled.  Stable renal function and electrolytes on labs completed 11/06/2022. Continue Diovan-HCT.        Dispo: 3 months with me  Signed, Eligha Bridegroom, NP-C

## 2022-12-09 ENCOUNTER — Encounter (HOSPITAL_BASED_OUTPATIENT_CLINIC_OR_DEPARTMENT_OTHER): Payer: Self-pay | Admitting: Nurse Practitioner

## 2022-12-09 ENCOUNTER — Ambulatory Visit (INDEPENDENT_AMBULATORY_CARE_PROVIDER_SITE_OTHER): Payer: Medicare Other | Admitting: Nurse Practitioner

## 2022-12-09 VITALS — BP 130/73 | HR 96 | Ht 64.0 in | Wt 126.0 lb

## 2022-12-09 DIAGNOSIS — R011 Cardiac murmur, unspecified: Secondary | ICD-10-CM | POA: Diagnosis not present

## 2022-12-09 DIAGNOSIS — I2510 Atherosclerotic heart disease of native coronary artery without angina pectoris: Secondary | ICD-10-CM

## 2022-12-09 DIAGNOSIS — I10 Essential (primary) hypertension: Secondary | ICD-10-CM | POA: Diagnosis not present

## 2022-12-09 DIAGNOSIS — R931 Abnormal findings on diagnostic imaging of heart and coronary circulation: Secondary | ICD-10-CM

## 2022-12-09 DIAGNOSIS — E785 Hyperlipidemia, unspecified: Secondary | ICD-10-CM | POA: Diagnosis not present

## 2022-12-09 MED ORDER — ROSUVASTATIN CALCIUM 20 MG PO TABS: 20.0000 mg | ORAL_TABLET | Freq: Every day | ORAL | 3 refills | Status: DC

## 2022-12-09 NOTE — Patient Instructions (Signed)
Medication Instructions:   DISCONTINUE Fenofibrate.  START Rosuvastatin one (1) tablet by mouth ( 20 mg) daily with your heaviest meal.    *If you need a refill on your cardiac medications before your next appointment, please call your pharmacy*   Lab Work:  Your physician recommends that you return for a FASTING labs prior to your appointment with Marcelino Duster on Nov 6.    If you have labs (blood work) drawn today and your tests are completely normal, you will receive your results only by: MyChart Message (if you have MyChart) OR A paper copy in the mail If you have any lab test that is abnormal or we need to change your treatment, we will call you to review the results.   Testing/Procedures:  Your physician has requested that you have an echocardiogram. Echocardiography is a painless test that uses sound waves to create images of your heart. It provides your doctor with information about the size and shape of your heart and how well your heart's chambers and valves are working. This procedure takes approximately one hour. There are no restrictions for this procedure. Please do NOT wear cologne, perfume or lotions (deodorant is allowed). Please arrive 15 minutes prior to your appointment time.    Follow-Up: At [facility], you and your health needs are our priority.  As part of our continuing mission to provide you with exceptional heart care, we have created designated Provider Care Teams.  These Care Teams include your primary Cardiologist (physician) and Advanced Practice Providers (APPs -  Physician Assistants and Nurse Practitioners) who all work together to provide you with the care you need, when you need it.  We recommend signing up for the patient portal called "MyChart".  Sign up information is provided on this After Visit Summary.  MyChart is used to connect with patients for Virtual Visits (Telemedicine).  Patients are able to view lab/test results, encounter notes,  upcoming appointments, etc.  Non-urgent messages can be sent to your provider as well.   To learn more about what you can do with MyChart, go to [email or web address].    Your next appointment:   3 month(s)  Provider:   Eligha Bridegroom, NP    Other Instructions Adopting a Healthy Lifestyle.   Weight: Know what a healthy weight is for you (roughly BMI <25) and aim to maintain this. You can calculate your body mass index on your smart phone. Unfortunately, this is not the most accurate measure of healthy weight, but it is the simplest measurement to use. A more accurate measurement involves body scanning which measures lean muscle, fat tissue and bony density. We do not have this equipment at [facility].    Diet: Aim for 7+ servings of fruits and vegetables daily Limit animal fats in diet for cholesterol and heart health - choose grass fed whenever available Avoid highly processed foods (fast food burgers, tacos, fried chicken, pizza, hot dogs, french fries)  Saturated fat comes in the form of butter, lard, coconut oil, margarine, partially hydrogenated oils, and fat in meat. These increase your risk of cardiovascular disease.  Use healthy plant oils, such as olive, canola, soy, corn, sunflower and peanut.  Whole foods such as fruits, vegetables and whole grains have fiber  Men need > 38 grams of fiber per day Women need > 25 grams of fiber per day  Load up on vegetables and fruits - one-half of your plate: Aim for color and variety, and remember that potatoes dont count. Go for  whole grains - one-quarter of your plate: Whole wheat, barley, wheat berries, quinoa, oats, brown rice, and foods made with them. If you want pasta, go with whole wheat pasta. Protein power - one-quarter of your plate: Fish, chicken, beans, and nuts are all healthy, versatile protein sources. Limit red meat. You need carbohydrates for energy! The type of carbohydrate is more important than the amount. Choose  carbohydrates such as vegetables, fruits, whole grains, beans, and nuts in the place of white rice, white pasta, potatoes (baked or fried), macaroni and cheese, cakes, cookies, and donuts.  If youre thirsty, drink water. Coffee and tea are good in moderation, but skip sugary drinks and limit milk and dairy products to one or two daily servings. Keep sugar intake at 6 teaspoons or 24 grams or LESS       Exercise: Aim for 150 min of moderate intensity exercise weekly for heart health, and weights twice weekly for bone health Stay active - any steps are better than no steps! Aim for 7-9 hours of sleep daily          Mediterranean Diet  Why follow it? Research shows. Those who follow the Mediterranean diet have a reduced risk of heart disease  The diet is associated with a reduced incidence of Parkinson's and Alzheimer's diseases People following the diet may have longer life expectancies and lower rates of chronic diseases  The Dietary Guidelines for Americans recommends the Mediterranean diet as an eating plan to promote health and prevent disease  What Is the Mediterranean Diet?  Healthy eating plan based on typical foods and recipes of Mediterranean-style cooking The diet is primarily a plant based diet; these foods should make up a majority of meals   Starches - Plant based foods should make up a majority of meals - They are an important sources of vitamins, minerals, energy, antioxidants, and fiber - Choose whole grains, foods high in fiber and minimally processed items  - Typical grain sources include wheat, oats, barley, corn, brown rice, bulgar, farro, millet, polenta, couscous  - Various types of beans include chickpeas, lentils, fava beans, black beans, white beans   Fruits  Veggies - Large quantities of antioxidant rich fruits & veggies; 6 or more servings  - Vegetables can be eaten raw or lightly drizzled with oil and cooked  - Vegetables common to the traditional  Mediterranean Diet include: artichokes, arugula, beets, broccoli, brussel sprouts, cabbage, carrots, celery, collard greens, cucumbers, eggplant, kale, leeks, lemons, lettuce, mushrooms, okra, onions, peas, peppers, potatoes, pumpkin, radishes, rutabaga, shallots, spinach, sweet potatoes, turnips, zucchini - Fruits common to the Mediterranean Diet include: apples, apricots, avocados, cherries, clementines, dates, figs, grapefruits, grapes, melons, nectarines, oranges, peaches, pears, pomegranates, strawberries, tangerines  Fats - Replace butter and margarine with healthy oils, such as olive oil, canola oil, and tahini  - Limit nuts to no more than a handful a day  - Nuts include walnuts, almonds, pecans, pistachios, pine nuts  - Limit or avoid candied, honey roasted or heavily salted nuts - Olives are central to the [employer] - can be eaten whole or used in a variety of dishes   Meats Protein - Limiting red meat: no more than a few times a month - When eating red meat: choose lean cuts and keep the portion to the size of deck of cards - Eggs: approx. 0 to 4 times a week  - Fish and lean poultry: at least 2 a week  - Healthy protein sources include, chicken,  [place], lean beef, lamb - Increase intake of seafood such as tuna, salmon, trout, mackerel, shrimp, scallops - Avoid or limit high fat processed meats such as sausage and bacon  Dairy - Include moderate amounts of low fat dairy products  - Focus on healthy dairy such as fat free yogurt, skim milk, low or reduced fat cheese - Limit dairy products higher in fat such as whole or 2% milk, cheese, ice cream  Alcohol - Moderate amounts of red wine is ok  - No more than 5 oz daily for women (all ages) and men older than age 59  - No more than 10 oz of wine daily for men younger than 42  Other - Limit sweets and other desserts  - Use herbs and spices instead of salt to flavor foods  - Herbs and spices common to the traditional Mediterranean  Diet include: basil, bay leaves, chives, cloves, cumin, fennel, garlic, lavender, marjoram, mint, oregano, parsley, pepper, rosemary, sage, savory, sumac, tarragon, thyme   It's not just a diet, it's a lifestyle:  The Mediterranean diet includes lifestyle factors typical of those in the region  Foods, drinks and meals are best eaten with others and savored Daily physical activity is important for overall good health This could be strenuous exercise like running and aerobics This could also be more leisurely activities such as walking, housework, yard-work, or taking the stairs Moderation is the key; a balanced and healthy diet accommodates most foods and drinks Consider portion sizes and frequency of consumption of certain foods   Meal Ideas & Options:  Breakfast:  Whole wheat toast or whole wheat English muffins with peanut butter & hard boiled egg Steel cut oats topped with apples & cinnamon and skim milk  Fresh fruit: banana, strawberries, melon, berries, peaches  Smoothies: strawberries, bananas, greek yogurt, peanut butter Low fat greek yogurt with blueberries and granola  Egg white omelet with spinach and mushrooms Breakfast couscous: whole wheat couscous, apricots, skim milk, cranberries  Sandwiches:  Hummus and grilled vegetables (peppers, zucchini, squash) on whole wheat bread   Grilled chicken on whole wheat pita with lettuce, tomatoes, cucumbers or tzatziki  [place] salad on whole wheat bread: tuna salad made with greek yogurt, olives, red peppers, capers, green onions Garlic rosemary lamb pita: lamb sauted with garlic, rosemary, salt & pepper; add lettuce, cucumber, greek yogurt to pita - flavor with lemon juice and black pepper  Seafood:  Mediterranean grilled salmon, seasoned with garlic, basil, parsley, lemon juice and black pepper Shrimp, lemon, and spinach whole-grain pasta salad made with low fat greek yogurt  Seared scallops with lemon orzo  Seared tuna steaks seasoned  salt, pepper, coriander topped with tomato mixture of olives, tomatoes, olive oil, minced garlic, parsley, green onions and cappers  Meats:  Herbed greek chicken salad with kalamata olives, cucumber, feta  Red bell peppers stuffed with spinach, bulgur, lean ground beef (or lentils) & topped with feta   Kebabs: skewers of chicken, tomatoes, onions, zucchini, squash  [place] burgers: made with red onions, mint, dill, lemon juice, feta cheese topped with roasted red peppers Vegetarian Cucumber salad: cucumbers, artichoke hearts, celery, red onion, feta cheese, tossed in olive oil & lemon juice  Hummus and whole grain pita points with a greek salad (lettuce, tomato, feta, olives, cucumbers, red onion) Lentil soup with celery, carrots made with vegetable broth, garlic, salt and pepper  Tabouli salad: parsley, bulgur, mint, scallions, cucumbers, tomato, radishes, lemon juice, olive oil, salt and pepper.

## 2022-12-31 ENCOUNTER — Ambulatory Visit (INDEPENDENT_AMBULATORY_CARE_PROVIDER_SITE_OTHER): Payer: Medicare Other

## 2022-12-31 DIAGNOSIS — R011 Cardiac murmur, unspecified: Secondary | ICD-10-CM

## 2022-12-31 DIAGNOSIS — E785 Hyperlipidemia, unspecified: Secondary | ICD-10-CM | POA: Diagnosis not present

## 2022-12-31 LAB — ECHOCARDIOGRAM COMPLETE
Area-P 1/2: 3.12 cm2
S' Lateral: 1.8 cm

## 2023-01-06 ENCOUNTER — Other Ambulatory Visit (HOSPITAL_BASED_OUTPATIENT_CLINIC_OR_DEPARTMENT_OTHER): Payer: Self-pay | Admitting: Internal Medicine

## 2023-01-06 DIAGNOSIS — Z1231 Encounter for screening mammogram for malignant neoplasm of breast: Secondary | ICD-10-CM

## 2023-01-07 ENCOUNTER — Ambulatory Visit (HOSPITAL_BASED_OUTPATIENT_CLINIC_OR_DEPARTMENT_OTHER)
Admission: RE | Admit: 2023-01-07 | Discharge: 2023-01-07 | Disposition: A | Discharge status: Home / Self Care | Payer: Medicare Other | Source: Ambulatory Visit | Attending: Internal Medicine | Admitting: Internal Medicine

## 2023-01-07 DIAGNOSIS — Z780 Asymptomatic menopausal state: Secondary | ICD-10-CM

## 2023-01-07 DIAGNOSIS — Z1231 Encounter for screening mammogram for malignant neoplasm of breast: Secondary | ICD-10-CM | POA: Insufficient documentation

## 2023-01-08 ENCOUNTER — Other Ambulatory Visit: Payer: Self-pay | Admitting: Internal Medicine

## 2023-01-08 DIAGNOSIS — R928 Other abnormal and inconclusive findings on diagnostic imaging of breast: Secondary | ICD-10-CM

## 2023-01-18 ENCOUNTER — Ambulatory Visit: Admission: RE | Admit: 2023-01-18 | Payer: Medicare Other | Source: Ambulatory Visit

## 2023-01-18 ENCOUNTER — Ambulatory Visit
Admission: RE | Admit: 2023-01-18 | Discharge: 2023-01-18 | Disposition: A | Discharge status: Home / Self Care | Payer: Medicare Other | Source: Ambulatory Visit | Attending: Internal Medicine | Admitting: Internal Medicine

## 2023-01-18 DIAGNOSIS — R928 Other abnormal and inconclusive findings on diagnostic imaging of breast: Secondary | ICD-10-CM

## 2023-01-24 ENCOUNTER — Encounter (HOSPITAL_BASED_OUTPATIENT_CLINIC_OR_DEPARTMENT_OTHER): Payer: Medicare Other | Admitting: Radiology

## 2023-01-24 DIAGNOSIS — Z1231 Encounter for screening mammogram for malignant neoplasm of breast: Secondary | ICD-10-CM

## 2023-03-07 LAB — NMR, LIPOPROFILE
Cholesterol, Total: 185 mg/dL (ref 100–199)
HDL Particle Number: 49 umol/L (ref >=30.5)
HDL-C: 69 mg/dL (ref >39)
LDL Particle Number: 1087 nmol/L — ABNORMAL HIGH (ref <1000)
LDL Size: 20 nmol — ABNORMAL LOW (ref >20.5)
LDL-C (NIH Calc): 81 mg/dL (ref 0–99)
LP-IR Score: 46 — ABNORMAL HIGH (ref <=45)
Small LDL Particle Number: 831 nmol/L — ABNORMAL HIGH (ref <=527)
Triglycerides: 214 mg/dL — ABNORMAL HIGH (ref 0–149)

## 2023-03-08 ENCOUNTER — Other Ambulatory Visit: Payer: Self-pay | Admitting: *Deleted

## 2023-03-08 DIAGNOSIS — E785 Hyperlipidemia, unspecified: Secondary | ICD-10-CM

## 2023-03-08 NOTE — Progress Notes (Unsigned)
Cardiology Office Note:  .   Date:  03/10/2023  ID:  Robin Mcdonald, DOB Jul 25, 1950, MRN [id number] PCP: Thana Ates, MD  [facility] Providers Cardiologist:  Chilton Si, MD Cardiology APP:  Levi Aland, NP    Patient Profile: .      PMH: Dyslipidemia Statin intolerance: does not specifically recall which agents but it was many years ago Hypertension Coronary artery disease CAC 1420 (98th percentile) on CT 12/03/22 Family history CAD, hyperlipidemia  Referred to cardiology for evaluation of elevated calcium score and seen by me on 12/09/22.She had recently established care with Dr. Margaretann Loveless for primary care and was encouraged to undergo coronary calcium score due to calcium seen on previous CT. She reports she has occasional palpitations but no chest pain, shortness of breath, dyspnea, or other concerning symptoms. Admits she does not get much exercise. 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 Admits that her dietary weakness is white bread.  Reported taking fenofibrate for a long time.  History of frequent leg cramps even when not on statin therapy.  She started long acting magnesium 1 to 2 months ago.  Her calculated ASCVD risk score was 12.2%.  She denied chest pain, shortness of breath, palpitations, orthopnea, PND, presyncope, syncope.  BP well-controlled.  She admitted to no regular exercise routine.  Diet consists of grits or oatmeal for breakfast half a sandwich and a handful of chips for lunch and dinners.  Most of which include red meat chicken or seafood.  She cooks with olive oil and maintains good hydration with water.  Fenofibrate was discontinued due to stable triglycerides.  She was advised to start rosuvastatin 20 mg daily.  Echocardiogram was completed 12/31/2022 for evaluation of murmur heard on exam and revealed normal LVEF 60 to 65%, G1 DD, normal RV, no significant valve disease.  She was advised to aim for 150 minutes of  moderate intensity exercise each week along with dietary changes to avoid saturated fat, simple carbohydrates, processed foods, and sugar.       History of Present Illness: .   Robin Mcdonald is a very pleasant 72 y.o. female who is here today for follow-up of elevated coronary artery calcium score.  She reports she is feeling well and has been working on dietary changes, incorporating more fruits, vegetables, and tofu into her meals. However, she is struggling with understanding what foods to substitute in her diet to prevent hunger and maintain a balanced diet. She is also trying to increase her physical activity but faces challenges due to work commitments and safety concerns in her area. She is interested in joining [employer]. She has severe cramps, particularly in her calf, which at one point left her unable to walk for three days. The cramps often occur at night when she straightens her legs. She has a family history of vascular issues, with her mother having had veins in her legs stripped due to vascular problems. She denies chest pain, shortness of breath, lower extremity edema, fatigue, palpitations, presyncope, syncope, orthopnea, and PND.  ROS: See HPI       Studies Reviewed: Marland Kitchen   EKG Interpretation Date/Time:  Wednesday March 10 2023 10:11:07 EST Ventricular Rate:  69 PR Interval:  194 QRS Duration:  82 QT Interval:  426 QTC Calculation: 456 R Axis:   64  Text Interpretation: Normal sinus rhythm Normal ECG When compared with ECG of 21-Apr-2022 08:47, No significant change was found No ST abnormality  Confirmed by Eligha Bridegroom [phone]) on 03/10/2023 10:13:00 AM     Risk Assessment/Calculations:             Physical Exam:   VS:  BP 118/62   Pulse 73   Ht 5\' 4"  (1.626 m)   Wt 122 lb 4.8 oz (55.5 kg)   SpO2 97%   BMI 20.99 kg/m    Wt Readings from Last 3 Encounters:  03/10/23 122 lb 4.8 oz (55.5 kg)  12/09/22 126 lb (57.2 kg)  04/21/22 126 lb 4.8 oz (57.3 kg)     GEN: Well nourished, well developed in no acute distress NECK: No JVD; No carotid bruits CARDIAC: RRR, 2/6 systolic murmur. No rubs, gallops RESPIRATORY:  Clear to auscultation without rales, wheezing or rhonchi  ABDOMEN: Soft, non-tender, non-distended EXTREMITIES:  No edema; No deformity     ASSESSMENT AND PLAN: .    Coronary artery disease: Coronary calcium score 1420 (98th percentile) with distribution of calcium across 4 main coronary arteries. She denies chest pain, dyspnea, or other symptoms concerning for angina. Lengthy discussion about further testing to rule out ischemia. EKG today without ST abnormality. She will let us know if she develops symptoms with increased activity.  Secondary prevention emphasized, including 150 minutes of moderate intensity exercise each week along with heart healthy diet low in saturated fat, cholesterol, sugar, and simple carbohydrates.   Dyslipidemia goal < 70: Lipoprotein a is not elevated. Lipid panel 03/04/23 reveals LDL particle  number 1087, LDL-C 81, triglycerides 214, small LDL particle 831, HDL 69.  Slight improvement in LDL but increased triglycerides. She is tolerating rosuvastatin 20 mg daily and does not feel the medication has intensified leg cramps.  We discussed additional dietary information and suggestions for healthy meals.  We will place a referral to dietitian for further guidance. Suggested use of TDEE website or My Fitness Pal for additional help.  Since she is working on improving her diet and increasing exercise, we will recheck lipids in February 2025.  Continue rosuvastatin.  Murmur: 2/6 systolic murmur noted on exam in the RUSB. Echo completed 12/31/22 reveals normal LVEF, G1 DD, no significant valve disease. Reviewed these findings in detail in light of hearing murmur on exam. No further testing indicated.   Hypertension: BP is well controlled.    Leg cramps: She has significant leg cramps at times that impair her mobility for a  few days afterwards. History of vascular disease in her mother.  Lengthy discussion about vascular screening.  She would like to go ahead and complete screening of carotids, aorta, and lower extremities.         Dispo: 6 months with me  Signed, Eligha Bridegroom, NP-C

## 2023-03-10 ENCOUNTER — Ambulatory Visit (HOSPITAL_BASED_OUTPATIENT_CLINIC_OR_DEPARTMENT_OTHER): Payer: Medicare Other | Admitting: Nurse Practitioner

## 2023-03-10 ENCOUNTER — Encounter (HOSPITAL_BASED_OUTPATIENT_CLINIC_OR_DEPARTMENT_OTHER): Payer: Self-pay | Admitting: Nurse Practitioner

## 2023-03-10 VITALS — BP 118/62 | HR 73 | Ht 64.0 in | Wt 122.3 lb

## 2023-03-10 DIAGNOSIS — R011 Cardiac murmur, unspecified: Secondary | ICD-10-CM | POA: Diagnosis not present

## 2023-03-10 DIAGNOSIS — R931 Abnormal findings on diagnostic imaging of heart and coronary circulation: Secondary | ICD-10-CM

## 2023-03-10 DIAGNOSIS — R252 Cramp and spasm: Secondary | ICD-10-CM

## 2023-03-10 DIAGNOSIS — I2510 Atherosclerotic heart disease of native coronary artery without angina pectoris: Secondary | ICD-10-CM

## 2023-03-10 DIAGNOSIS — E785 Hyperlipidemia, unspecified: Secondary | ICD-10-CM | POA: Diagnosis not present

## 2023-03-10 DIAGNOSIS — I10 Essential (primary) hypertension: Secondary | ICD-10-CM

## 2023-03-10 LAB — LIPOPROTEIN A (LPA): Lipoprotein (a): 37.2 nmol/L (ref <75.0)

## 2023-03-10 NOTE — Patient Instructions (Signed)
Medication Instructions:   Your physician recommends that you continue on your current medications as directed. Please refer to the Current Medication list given to you today.   *If you need a refill on your cardiac medications before your next appointment, please call your pharmacy*   Lab Work:  Fasting Labs in February. Paperwork given to patient today.   If you have labs (blood work) drawn today and your tests are completely normal, you will receive your results only by: MyChart Message (if you have MyChart) OR A paper copy in the mail If you have any lab test that is abnormal or we need to change your treatment, we will call you to review the results.   Testing/Procedures:  Vascular Screening/Carotid/Aorta/Lower extremity. Will have to reach out to let you know about this test.    Follow-Up: At [facility], you and your health needs are our priority.  As part of our continuing mission to provide you with exceptional heart care, we have created designated Provider Care Teams.  These Care Teams include your primary Cardiologist (physician) and Advanced Practice Providers (APPs -  Physician Assistants and Nurse Practitioners) who all work together to provide you with the care you need, when you need it.  We recommend signing up for the patient portal called "MyChart".  Sign up information is provided on this After Visit Summary.  MyChart is used to connect with patients for Virtual Visits (Telemedicine).  Patients are able to view lab/test results, encounter notes, upcoming appointments, etc.  Non-urgent messages can be sent to your provider as well.   To learn more about what you can do with MyChart, go to [email or web address].    Your next appointment:   6 month(s)  Provider:   Eligha Bridegroom, NP    Other Instructions  Please check out TDEE or My Fitness Pal for calorie calculator.  You have been referred to Nutritionist. The office will call you to schedule  appointment.

## 2023-03-15 ENCOUNTER — Ambulatory Visit: Payer: Medicare Other | Admitting: Dermatology

## 2023-03-18 ENCOUNTER — Encounter: Payer: Self-pay | Admitting: Dermatology

## 2023-03-18 ENCOUNTER — Ambulatory Visit: Payer: Medicare Other | Admitting: Dermatology

## 2023-03-18 DIAGNOSIS — Z808 Family history of malignant neoplasm of other organs or systems: Secondary | ICD-10-CM

## 2023-03-18 DIAGNOSIS — Z1283 Encounter for screening for malignant neoplasm of skin: Secondary | ICD-10-CM

## 2023-03-18 DIAGNOSIS — L578 Other skin changes due to chronic exposure to nonionizing radiation: Secondary | ICD-10-CM

## 2023-03-18 DIAGNOSIS — W908XXA Exposure to other nonionizing radiation, initial encounter: Secondary | ICD-10-CM | POA: Diagnosis not present

## 2023-03-18 DIAGNOSIS — L821 Other seborrheic keratosis: Secondary | ICD-10-CM

## 2023-03-18 DIAGNOSIS — L570 Actinic keratosis: Secondary | ICD-10-CM

## 2023-03-18 DIAGNOSIS — L814 Other melanin hyperpigmentation: Secondary | ICD-10-CM

## 2023-03-18 DIAGNOSIS — D229 Melanocytic nevi, unspecified: Secondary | ICD-10-CM

## 2023-03-18 DIAGNOSIS — D1801 Hemangioma of skin and subcutaneous tissue: Secondary | ICD-10-CM

## 2023-03-18 NOTE — Progress Notes (Signed)
   Total Body Skin Exam (TBSE) Visit   Subjective  Robin Mcdonald is a 72 y.o. female who presents for the following: Skin Cancer Screening and Full Body Skin Exam  Patient presents today for follow up visit for TBSE. Patient was last evaluated on 11/25/22 . Patient denies medication changes. Patient reports she does have spots, moles and lesions of concern to be evaluated. Patient reports throughout her lifetime she has had moderate sun exposure. Currently, patient reports if she has excessive sun exposure, she does apply sunscreen and/or wears protective coverings. Patient reports she has hx of bx. Patient reports  family history of skin cancers  (dad - melanoma). The patient has spots, moles and lesions to be evaluated, some may be new or changing and the patient has concerns that these could be cancer.  The following portions of the chart were reviewed this encounter and updated as appropriate: medications, allergies, medical history  Review of Systems:  No other skin or systemic complaints except as noted in HPI or Assessment and Plan.  Objective  Well appearing patient in no apparent distress; mood and affect are within normal limits.  A full examination was performed including scalp, head, eyes, ears, nose, lips, neck, chest, axillae, abdomen, back, buttocks, bilateral upper extremities, bilateral lower extremities, hands, feet, fingers, toes, fingernails, and toenails. All findings within normal limits unless otherwise noted below.   Relevant physical exam findings are noted in the Assessment and Plan.  Right Lower Leg, chest, right hand, bilateral arms, back (8) Erythematous thin papules/macules with gritty scale.    Assessment & Plan   LENTIGINES, SEBORRHEIC KERATOSES, HEMANGIOMAS - Benign normal skin lesions - Benign-appearing - Call for any changes  MELANOCYTIC NEVI - Tan-brown and/or pink-flesh-colored symmetric macules and papules - Benign appearing on exam today -  Observation - Call clinic for new or changing moles - Recommend daily use of broad spectrum spf 30+ sunscreen to sun-exposed areas.   MILD ACTINIC DAMAGE - Chronic condition, secondary to cumulative UV/sun exposure - diffuse scaly erythematous macules with underlying dyspigmentation - Recommend daily broad spectrum sunscreen SPF 30+ to sun-exposed areas, reapply every 2 hours as needed.  - Staying in the shade or wearing long sleeves, sun glasses (UVA+UVB protection) and wide brim hats (4-inch brim around the entire circumference of the hat) are also recommended for sun protection.  - Call for new or changing lesions.  SKIN CANCER SCREENING PERFORMED TODAY.   AK (actinic keratosis) (8) Right Lower Leg, chest, right hand, bilateral arms, back  Destruction of lesion - Right Lower Leg, chest, right hand, bilateral arms, back (8) Complexity: simple   Destruction method: cryotherapy   Informed consent: discussed and consent obtained   Timeout:  patient name, date of birth, surgical site, and procedure verified Lesion destroyed using liquid nitrogen: Yes   Region frozen until ice ball extended beyond lesion: Yes   Outcome: patient tolerated procedure well with no complications   Post-procedure details: wound care instructions given     No follow-ups on file.    Documentation: I have reviewed the above documentation for accuracy and completeness, and I agree with the above.   I, Treyvon Blahut Marcha Solders, CMA, am acting as scribe for [employer], DO.   Langston Reusing, DO

## 2023-03-18 NOTE — Patient Instructions (Signed)

## 2023-03-26 ENCOUNTER — Ambulatory Visit (HOSPITAL_BASED_OUTPATIENT_CLINIC_OR_DEPARTMENT_OTHER): Payer: Self-pay

## 2023-03-26 DIAGNOSIS — R011 Cardiac murmur, unspecified: Secondary | ICD-10-CM

## 2023-04-20 ENCOUNTER — Encounter: Payer: Self-pay | Admitting: Cardiology

## 2023-09-06 NOTE — Progress Notes (Signed)
 Cardiology Office Note:  .   Date:  09/13/2023  ID:  Robin Mcdonald, DOB 29-Oct-1950, MRN [id number] PCP: Robin Feeling, MD  [facility] Providers Cardiologist:  Robin Sos, MD Cardiology APP:  Robin Koch, NP    Patient Profile: .      PMH: Dyslipidemia Statin intolerance: does not specifically recall which agents but it was many years ago Hypertension Coronary artery disease CAC 1420 (98th percentile) on CT 12/03/22 LM 111, LAD 630, LCx 528, RCA 150 Family history CAD, hyperlipidemia  Referred to cardiology for evaluation of elevated calcium  score and seen by me on 12/09/22.She had recently established care with Dr. Candi Mcdonald for primary care and was encouraged to undergo coronary calcium  score due to calcium  seen on previous CT. She reports she has occasional palpitations but no chest pain, shortness of breath, dyspnea, or other concerning symptoms. Admits she does not get much exercise. 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 Admits that her dietary weakness is white bread.  Reported taking fenofibrate for a long time.  History of frequent leg cramps even when not on statin therapy.  She started long acting magnesium 1 to 2 months ago. Calculated ASCVD risk score was 12.2%.  BP well-controlled.  She admitted to no regular exercise routine.  Diet consists of grits or oatmeal for breakfast half a sandwich and a handful of chips for lunch and dinners mostly include red meat chicken or seafood.  She cooks with olive oil and maintains good hydration with water .  Fenofibrate was discontinued due to stable triglycerides. She was advised to start rosuvastatin  20 mg daily.  Echocardiogram was completed 12/31/2022 for evaluation of murmur heard on exam and revealed normal LVEF 60 to 65%, G1 DD, normal RV, no significant valve disease. Advised to aim for 150 minutes of moderate intensity exercise each week along with dietary changes to avoid saturated fat,  simple carbohydrates, processed foods, and sugar.  Seen by me in follow-up on 03/10/2023. She reports she is Mcdonald well and has been working on dietary changes, incorporating more fruits, vegetables, and tofu into her meals. However, she is struggling with understanding what foods to substitute in her diet to prevent hunger and maintain a balanced diet. She is also trying to increase her physical activity but faces challenges due to work commitments and safety concerns in her area. She is interested in joining [employer]. She has severe cramps, particularly in her calf, which at one point left her unable to walk for three days. The cramps often occur at night when she straightens her legs. She has a family history of vascular issues, with her mother having had veins in her legs stripped due to vascular problems. She denies chest pain, shortness of breath, lower extremity edema, fatigue, palpitations, presyncope, syncope, orthopnea, and PND.       History of Present Illness: .    History of Present Illness Robin Mcdonald is a very pleasant 73 year old female who presents for follow-up of CAD. She reports she is Mcdonald well but continues to experience intermittent leg cramps, which are less frequent and less severe than previously reported. She is not formally exercising but has been incorporating more physical activity into her daily routine. She denies chest pain, shortness of breath, lower extremity edema, fatigue, palpitations, presyncope, syncope, orthopnea, and PND. She has had to return to office for work but is planning to retire at the end of August. She plans to start  exercising formally at this time. She reports she is making more heart healthy dietary choices and has experienced some weight loss as a result.   ROS: See HPI       Studies Reviewed: .         Risk Assessment/Calculations:             Physical Exam:   VS:  BP 118/72 (BP Location: Left Arm, Patient Position: Sitting, Cuff  Size: Normal)   Pulse 86   Ht 5\' 4"  (1.626 m)   Wt 109 lb 1.6 oz (49.5 kg)   SpO2 100%   BMI 18.73 kg/m    Wt Readings from Last 3 Encounters:  09/13/23 109 lb 1.6 oz (49.5 kg)  03/10/23 122 lb 4.8 oz (55.5 kg)  12/09/22 126 lb (57.2 kg)    GEN: Well nourished, well developed in no acute distress NECK: No JVD; No carotid bruits CARDIAC: RRR, 2/6 systolic murmur. No rubs, gallops RESPIRATORY:  Clear to auscultation without rales, wheezing or rhonchi  ABDOMEN: Soft, non-tender, non-distended EXTREMITIES:  No edema; No deformity     ASSESSMENT AND PLAN: .    Coronary artery disease: Coronary calcium  score 1420 (98th percentile) with distribution of calcium  across 4 main coronary arteries. She remains active during the day but is not exercising consistently. She plans to start a formal exercise program when she retires in August. She denies chest pain, dyspnea, or other symptoms concerning for angina. She continues to decline further testing to evaluate for ischemia at this time. Encouraged her to notify us  if she develops concerning symptoms. Reports she is eating a more heart healthy diet. Secondary prevention emphasized, including 150 minutes of moderate intensity exercise each week along with heart healthy diet low in saturated fat, cholesterol, sugar, and simple carbohydrates. We will get A1C for screening of diabetes for further risk stratification.   Dyslipidemia goal < 70: Lipoprotein a is not elevated. Lipid panel 03/04/23 reveals LDL particle  number 1087, LDL-C 81, triglycerides 214, small LDL particle 831, HDL 69.  Slight improvement in LDL but increased triglycerides. She is tolerating rosuvastatin  20 mg daily without significant side effects. Reports eating a more heart healthy diet and generally more active throughout the day. No formal exercise currently but plans to start in a few months when she retires. We will recheck lipid and liver panels today. Continue  rosuvastatin .  Murmur: 2/6 systolic murmur noted on exam 10/9627. Echo completed 12/31/22 reveals normal LVEF, G1 DD, no significant valve disease. Murmur is less audible today. No further testing indicated at this time.   Hypertension: BP is well controlled. We will update labs to ensure stable renal function. No medication changes today.   Leg cramps: She reports occasional leg cramps not as significant as previously described. She underwent vascular screening with normal ABIs. No significant concerns today.          Disposition: 1 year with Dr. Theodis Fiscal or APP  Signed, Slater Duncan, NP-C

## 2023-09-13 ENCOUNTER — Encounter (HOSPITAL_BASED_OUTPATIENT_CLINIC_OR_DEPARTMENT_OTHER): Payer: Self-pay | Admitting: Nurse Practitioner

## 2023-09-13 ENCOUNTER — Ambulatory Visit (INDEPENDENT_AMBULATORY_CARE_PROVIDER_SITE_OTHER): Payer: Medicare Other | Admitting: Nurse Practitioner

## 2023-09-13 VITALS — BP 118/72 | HR 86 | Ht 64.0 in | Wt 109.1 lb

## 2023-09-13 DIAGNOSIS — R011 Cardiac murmur, unspecified: Secondary | ICD-10-CM

## 2023-09-13 DIAGNOSIS — I10 Essential (primary) hypertension: Secondary | ICD-10-CM

## 2023-09-13 DIAGNOSIS — R931 Abnormal findings on diagnostic imaging of heart and coronary circulation: Secondary | ICD-10-CM | POA: Diagnosis not present

## 2023-09-13 DIAGNOSIS — Z131 Encounter for screening for diabetes mellitus: Secondary | ICD-10-CM | POA: Diagnosis not present

## 2023-09-13 DIAGNOSIS — E785 Hyperlipidemia, unspecified: Secondary | ICD-10-CM | POA: Diagnosis not present

## 2023-09-13 DIAGNOSIS — I2510 Atherosclerotic heart disease of native coronary artery without angina pectoris: Secondary | ICD-10-CM | POA: Diagnosis not present

## 2023-09-13 DIAGNOSIS — R252 Cramp and spasm: Secondary | ICD-10-CM

## 2023-09-13 MED ORDER — ROSUVASTATIN CALCIUM 20 MG PO TABS: 20.0000 mg | ORAL_TABLET | Freq: Every day | ORAL | 3 refills | Status: DC

## 2023-09-13 NOTE — Patient Instructions (Signed)
 Medication Instructions:   Your physician recommends that you continue on your current medications as directed. Please refer to the Current Medication list given to you today.   *If you need a refill on your cardiac medications before your next appointment, please call your pharmacy*  Lab Work:  TODAY!!!! CMET/NMR/A1C  If you have labs (blood work) drawn today and your tests are completely normal, you will receive your results only by: MyChart Message (if you have MyChart) OR A paper copy in the mail If you have any lab test that is abnormal or we need to change your treatment, we will call you to review the results.  Testing/Procedures:  None ordered.  Follow-Up: At [facility], you and your health needs are our priority.  As part of our continuing mission to provide you with exceptional heart care, our providers are all part of one team.  This team includes your primary Cardiologist (physician) and Advanced Practice Providers or APPs (Physician Assistants and Nurse Practitioners) who all work together to provide you with the care you need, when you need it.  Your next appointment:   1 year(s)  Provider:   Maudine Sos, MD, Slater Duncan, NP, or Neomi Banks, NP    We recommend signing up for the patient portal called "MyChart".  Sign up information is provided on this After Visit Summary.  MyChart is used to connect with patients for Virtual Visits (Telemedicine).  Patients are able to view lab/test results, encounter notes, upcoming appointments, etc.  Non-urgent messages can be sent to your provider as well.   To learn more about what you can do with MyChart, go to [email or web address].   Other Instructions  Your physician wants you to follow-up in: 1 year.  You will receive a reminder letter in the mail two months in advance. If you don't receive a letter, please call our office to schedule the follow-up appointment.

## 2023-09-14 LAB — COMPREHENSIVE METABOLIC PANEL WITH GFR
ALT: 17 IU/L (ref 0–32)
AST: 19 IU/L (ref 0–40)
Albumin: 4.9 g/dL — ABNORMAL HIGH (ref 3.8–4.8)
Alkaline Phosphatase: 74 IU/L (ref 44–121)
BUN/Creatinine Ratio: 21 (ref 12–28)
BUN: 16 mg/dL (ref 8–27)
Bilirubin Total: 0.5 mg/dL (ref 0.0–1.2)
CO2: 27 mmol/L (ref 20–29)
Calcium: 10.3 mg/dL (ref 8.7–10.3)
Chloride: 100 mmol/L (ref 96–106)
Creatinine, Ser: 0.78 mg/dL (ref 0.57–1.00)
Globulin, Total: 2 g/dL (ref 1.5–4.5)
Glucose: 82 mg/dL (ref 70–99)
Potassium: 4.4 mmol/L (ref 3.5–5.2)
Sodium: 143 mmol/L (ref 134–144)
Total Protein: 6.9 g/dL (ref 6.0–8.5)
eGFR: 81 mL/min/{1.73_m2} (ref >59)

## 2023-09-14 LAB — HEMOGLOBIN A1C
Est. average glucose Bld gHb Est-mCnc: 120 mg/dL
Hgb A1c MFr Bld: 5.8 % — ABNORMAL HIGH (ref 4.8–5.6)

## 2023-09-14 LAB — NMR, LIPOPROFILE
Cholesterol, Total: 161 mg/dL (ref 100–199)
HDL Particle Number: 55.5 umol/L (ref >=30.5)
HDL-C: 87 mg/dL (ref >39)
LDL Particle Number: 592 nmol/L (ref <1000)
LDL Size: 19.8 nm — ABNORMAL LOW (ref >20.5)
LDL-C (NIH Calc): 60 mg/dL (ref 0–99)
LP-IR Score: 37 (ref <=45)
Small LDL Particle Number: 449 nmol/L (ref <=527)
Triglycerides: 75 mg/dL (ref 0–149)

## 2023-09-15 ENCOUNTER — Encounter: Payer: Self-pay | Admitting: Dermatology

## 2023-09-15 ENCOUNTER — Ambulatory Visit: Payer: Medicare Other | Admitting: Dermatology

## 2023-09-15 ENCOUNTER — Ambulatory Visit (HOSPITAL_BASED_OUTPATIENT_CLINIC_OR_DEPARTMENT_OTHER): Payer: Self-pay | Admitting: Nurse Practitioner

## 2023-09-15 ENCOUNTER — Encounter (HOSPITAL_BASED_OUTPATIENT_CLINIC_OR_DEPARTMENT_OTHER): Payer: Medicare Other | Admitting: Nurse Practitioner

## 2023-09-15 VITALS — BP 103/64 | HR 83

## 2023-09-15 DIAGNOSIS — L814 Other melanin hyperpigmentation: Secondary | ICD-10-CM | POA: Diagnosis not present

## 2023-09-15 DIAGNOSIS — W908XXA Exposure to other nonionizing radiation, initial encounter: Secondary | ICD-10-CM

## 2023-09-15 DIAGNOSIS — L578 Other skin changes due to chronic exposure to nonionizing radiation: Secondary | ICD-10-CM

## 2023-09-15 DIAGNOSIS — L821 Other seborrheic keratosis: Secondary | ICD-10-CM | POA: Diagnosis not present

## 2023-09-15 DIAGNOSIS — L570 Actinic keratosis: Secondary | ICD-10-CM

## 2023-09-15 DIAGNOSIS — D1801 Hemangioma of skin and subcutaneous tissue: Secondary | ICD-10-CM | POA: Diagnosis not present

## 2023-09-15 DIAGNOSIS — Z1283 Encounter for screening for malignant neoplasm of skin: Secondary | ICD-10-CM

## 2023-09-15 DIAGNOSIS — D229 Melanocytic nevi, unspecified: Secondary | ICD-10-CM

## 2023-09-15 DIAGNOSIS — Z85828 Personal history of other malignant neoplasm of skin: Secondary | ICD-10-CM

## 2023-09-15 DIAGNOSIS — Z808 Family history of malignant neoplasm of other organs or systems: Secondary | ICD-10-CM

## 2023-09-15 NOTE — Patient Instructions (Addendum)
 Actinic keratoses are precancerous spots that appear secondary to cumulative UV radiation exposure/sun exposure over time. They are chronic with expected duration over 1 year. A portion of actinic keratoses will progress to squamous cell carcinoma of the skin. It is not possible to reliably predict which spots will progress to skin cancer and so treatment is recommended to prevent development of skin cancer.  Recommend daily broad spectrum sunscreen SPF 30+ to sun-exposed areas, reapply every 2 hours as needed.  Recommend staying in the shade or wearing long sleeves, sun glasses (UVA+UVB protection) and wide brim hats (4-inch brim around the entire circumference of the hat). Call for new or changing lesions.  Prior to procedure, discussed risks of blister formation, small wound, skin dyspigmentation, or rare scar following cryotherapy. Recommend Vaseline ointment to treated areas while healing.    Important Information  Due to recent changes in healthcare laws, you may see results of your pathology and/or laboratory studies on MyChart before the doctors have had a chance to review them. We understand that in some cases there may be results that are confusing or concerning to you. Please understand that not all results are received at the same time and often the doctors may need to interpret multiple results in order to provide you with the best plan of care or course of treatment. Therefore, we ask that you please give us  2 business days to thoroughly review all your results before contacting the office for clarification. Should we see a critical lab result, you will be contacted sooner.   If You Need Anything After Your Visit  If you have any questions or concerns for your doctor, please call our main line at [phone] If no one answers, please leave a voicemail as directed and we will return your call as soon as possible. Messages left after 4 pm will be answered the following business day.    You may also send us  a message via MyChart. We typically respond to MyChart messages within 1-2 business days.  For prescription refills, please ask your pharmacy to contact our office. Our fax number is [phone].  If you have an urgent issue when the clinic is closed that cannot wait until the next business day, you can page your doctor at the number below.    Please note that while we do our best to be available for urgent issues outside of office hours, we are not available 24/7.   If you have an urgent issue and are unable to reach us , you may choose to seek medical care at your doctor's office, retail clinic, urgent care center, or emergency room.  If you have a medical emergency, please immediately call 911 or go to the emergency department. In the event of inclement weather, please call our main line at [phone] for an update on the status of any delays or closures.  Dermatology Medication Tips: Please keep the boxes that topical medications come in in order to help keep track of the instructions about where and how to use these. Pharmacies typically print the medication instructions only on the boxes and not directly on the medication tubes.   If your medication is too expensive, please contact our office at [phone] or send us  a message through MyChart.   We are unable to tell what your co-pay for medications will be in advance as this is different depending on your insurance coverage. However, we may be able to find a substitute medication at lower cost or fill out paperwork  to get insurance to cover a needed medication.   If a prior authorization is required to get your medication covered by your insurance company, please allow us  1-2 business days to complete this process.  Drug prices often vary depending on where the prescription is filled and some pharmacies may offer cheaper prices.  The website www.goodrx.com contains coupons for medications through different  pharmacies. The prices here do not account for what the cost may be with help from insurance (it may be cheaper with your insurance), but the website can give you the price if you did not use any insurance.  - You can print the associated coupon and take it with your prescription to the pharmacy.  - You may also stop by our office during regular business hours and pick up a GoodRx coupon card.  - If you need your prescription sent electronically to a different pharmacy, notify our office through [facility] or by phone at [phone]

## 2023-09-15 NOTE — Progress Notes (Signed)
   Total Body Skin Exam (TBSE) Visit   Subjective  Robin Mcdonald is a 73 y.o. female who presents for the following: Skin Cancer Screening and Full Body Skin Exam  Patient presents today for follow up visit for TBSE. Patient was last evaluated on 03/18/2023 . Patient denies medication changes. Patient reports she does have spots, moles and lesions of concern to be evaluated. Patient reports throughout her lifetime she has had moderate sun exposure. Currently, patient reports if she has excessive sun exposure, she does apply sunscreen and/or wears protective coverings. Patient reports she has hx of bx of SCC left lower leg. Patient admits to  family history of skin cancers.   The patient has spots, moles and lesions to be evaluated, some may be new or changing and the patient has concerns that these could be cancer.  The following portions of the chart were reviewed this encounter and updated as appropriate: medications, allergies, medical history  Review of Systems:  No other skin or systemic complaints except as noted in HPI or Assessment and Plan.  Objective  Well appearing patient in no apparent distress; mood and affect are within normal limits.  A full examination was performed including scalp, head, eyes, ears, nose, lips, neck, chest, axillae, abdomen, back, buttocks, bilateral upper extremities, bilateral lower extremities, hands, feet, fingers, toes, fingernails, and toenails. All findings within normal limits unless otherwise noted below.   Relevant physical exam findings are noted in the Assessment and Plan.    Assessment & Plan   LENTIGINES, SEBORRHEIC KERATOSES, HEMANGIOMAS - Benign normal skin lesions - Benign-appearing - Call for any changes  MELANOCYTIC NEVI - Tan-brown and/or pink-flesh-colored symmetric macules and papules - Benign appearing on exam today - Observation - Call clinic for new or changing moles - Recommend daily use of broad spectrum spf 30+  sunscreen to sun-exposed areas.   ACTINIC DAMAGE - Chronic condition, secondary to cumulative UV/sun exposure - diffuse scaly erythematous macules with underlying dyspigmentation - Recommend daily broad spectrum sunscreen SPF 30+ to sun-exposed areas, reapply every 2 hours as needed.  - Staying in the shade or wearing long sleeves, sun glasses (UVA+UVB protection) and wide brim hats (4-inch brim around the entire circumference of the hat) are also recommended for sun protection.  - Call for new or changing lesions.  SKIN CANCER SCREENING PERFORMED TODAY.    ACTINIC KERATOSIS (5) Left Leg , Left Arm , Left Shoulder, Left Upper Arm - Anterior, Right Arm , Right Hand , Right Leg, Right Lower Leg - Anterior, Right Shoulder - Anterior Related Procedures Destruction of lesion Complexity: simple   Destruction method: cryotherapy   Informed consent: discussed and consent obtained   Timeout:  patient name, date of birth, surgical site, and procedure verified Lesion destroyed using liquid nitrogen: Yes   Region frozen until ice ball extended beyond lesion: Yes   Outcome: patient tolerated procedure well with no complications   Post-procedure details: wound care instructions given   No follow-ups on file.  Exie Holler, CMA, am acting as scribe for [employer], DO.   Documentation: I have reviewed the above documentation for accuracy and completeness, and I agree with the above.  Louana Roup, DO

## 2023-11-15 ENCOUNTER — Encounter: Payer: Self-pay | Admitting: Dermatology

## 2023-11-24 ENCOUNTER — Other Ambulatory Visit (HOSPITAL_BASED_OUTPATIENT_CLINIC_OR_DEPARTMENT_OTHER): Payer: Self-pay | Admitting: Nurse Practitioner

## 2023-11-24 DIAGNOSIS — I2510 Atherosclerotic heart disease of native coronary artery without angina pectoris: Secondary | ICD-10-CM

## 2023-11-24 DIAGNOSIS — R931 Abnormal findings on diagnostic imaging of heart and coronary circulation: Secondary | ICD-10-CM

## 2023-11-24 DIAGNOSIS — E785 Hyperlipidemia, unspecified: Secondary | ICD-10-CM

## 2023-12-03 ENCOUNTER — Encounter: Payer: Self-pay | Admitting: Pediatrics

## 2023-12-08 ENCOUNTER — Ambulatory Visit: Admitting: Dermatology

## 2023-12-08 VITALS — BP 108/66

## 2023-12-08 DIAGNOSIS — L821 Other seborrheic keratosis: Secondary | ICD-10-CM

## 2023-12-08 DIAGNOSIS — L820 Inflamed seborrheic keratosis: Secondary | ICD-10-CM

## 2023-12-08 DIAGNOSIS — L570 Actinic keratosis: Secondary | ICD-10-CM | POA: Diagnosis not present

## 2023-12-08 NOTE — Patient Instructions (Signed)

## 2023-12-08 NOTE — Progress Notes (Signed)
   Follow-Up Visit   Subjective  Robin Mcdonald is a 73 y.o. female who presents for the following: Spot of left back/shoulderblade that she noticed about 6 weeks ago. It has an achy pain and the skin around it tingles.   The following portions of the chart were reviewed this encounter and updated as appropriate: medications, allergies, medical history  Review of Systems:  No other skin or systemic complaints except as noted in HPI or Assessment and Plan.  Objective  Well appearing patient in no apparent distress; mood and affect are within normal limits.   A focused examination was performed of the following areas: Back   Relevant exam findings are noted in the Assessment and Plan.  Left mid back Erythematous thin papules/macules with gritty scale.  Left mid back Erythematous stuck-on, waxy papule or plaque  Assessment & Plan   1. Actinic Keratosis and Seborrheic Keratosis - Assessment: Patient has two lesions on her back: an actinic keratosis presenting as a keratin horn, and a seborrheic keratosis above it. The actinic keratosis is symptomatic, causing constant pain and itching. The seborrheic keratosis is likely irritated. The actinic keratosis is of particular concern due to its symptomatic nature and potential for abnormal cells deep in the lesion. - Plan:    Apply cryotherapy to both lesions with aggressive freezing on the actinic keratosis to destroy potential abnormal cells    Apply Aquaphor and Band-Aid to treated area    Instruct patient to expect itching, stinging, and burning post-treatment    Educate that lesion will likely blister and scab    Continue Aquaphor application at home    Remove initial dressing tomorrow morning and reapply  Follow-up appointment scheduled for November. AK (ACTINIC KERATOSIS) Left mid back Destruction of lesion - Left mid back Complexity: simple   Destruction method: cryotherapy   Informed consent: discussed and consent obtained    Timeout:  patient name, date of birth, surgical site, and procedure verified Lesion destroyed using liquid nitrogen: Yes   Region frozen until ice ball extended beyond lesion: Yes   Outcome: patient tolerated procedure well with no complications   Post-procedure details: wound care instructions given    INFLAMED SEBORRHEIC KERATOSIS Left mid back Symptomatic, irritating, patient would like treated.  Benign-appearing.  Call clinic for new or changing lesions.   Destruction of lesion - Left mid back Complexity: simple   Destruction method: cryotherapy   Informed consent: discussed and consent obtained   Timeout:  patient name, date of birth, surgical site, and procedure verified Lesion destroyed using liquid nitrogen: Yes   Region frozen until ice ball extended beyond lesion: Yes   Outcome: patient tolerated procedure well with no complications   Post-procedure details: wound care instructions given     Return for Follow up as scheduled  AK follow up.    Documentation: I have reviewed the above documentation for accuracy and completeness, and I agree with the above.  Delon Lenis, DO

## 2024-01-19 ENCOUNTER — Encounter: Payer: Self-pay | Admitting: Pediatrics

## 2024-01-19 ENCOUNTER — Ambulatory Visit (AMBULATORY_SURGERY_CENTER)

## 2024-01-19 VITALS — Ht 64.0 in | Wt 107.0 lb

## 2024-01-19 DIAGNOSIS — Z860100 Personal history of colon polyps, unspecified: Secondary | ICD-10-CM

## 2024-01-19 MED ORDER — NA SULFATE-K SULFATE-MG SULF 17.5-3.13-1.6 GM/177ML PO SOLN: 1.0000 | Freq: Once | ORAL | 0 refills | Status: AC

## 2024-01-19 NOTE — Progress Notes (Signed)
 No issues known to pt with past sedation with any surgeries or procedures Patient denies ever being told they had issues or difficulty with intubation  No FH of Malignant Hyperthermia Pt is not on diet pills or GLP-1 medications Pt is not on home 02  Pt is not on blood thinners  Pt states issues with occasional constipation; uses stool softener approx. 2 times/week to resolve No A fib or A flutter Have any cardiac testing pending--no Pt instructed to use Singlecare.com or GoodRx for a price reduction on prep  Ambulates independently

## 2024-01-30 NOTE — Progress Notes (Unsigned)
 [facility] History and Physical   Primary Care Physician:  Dwight Trula SQUIBB, MD   Reason for Procedure:  History of adenomatous colon polyps  Plan:    Surveillance colonoscopy     HPI: Robin Mcdonald is a 73 y.o. female undergoing surveillance colonoscopy for history of adenomatous colon polyps.  Last colonoscopy performed in 2020 disclosed one 7 mm tubular adenoma.  Previous colonoscopy in 2017 disclosed 7 polyps all of which were tubular adenomas.  1 polyp was 10 mm in size.  The patient's father was reported to have had colon polyps.  No family history of colorectal cancer.   Past Medical History:  Diagnosis Date   Acute gastroenteritis 09/10/2022   Allergic rhinitis    Allergy    Arthritis    Asthma    Cataract    small forming   GERD (gastroesophageal reflux disease)    H/O bladder infections    Hemorrhoids    Hiatal hernia    Hyperlipidemia    Hypertension    Hypothyroidism    IBS (irritable bowel syndrome)    Incontinence    Squamous cell carcinoma of skin 08/27/2022   Anterior left lower leg. Excicion    Past Surgical History:  Procedure Laterality Date   ABDOMINAL HYSTERECTOMY  2003   [place] Bone  05/2010   BREAST BIOPSY  1990   Left breast and benign   BREAST EXCISIONAL BIOPSY Left    COLONOSCOPY     Cyst Left Hand     CYSTOSCOPY W/ RETROGRADES Bilateral 04/21/2022   Procedure: CYSTOSCOPY WITH RETROGRADE PYELOGRAM;  Surgeon: Rosalind Zachary NOVAK, MD;  Location: [facility];  Service: Urology;  Laterality: Bilateral;   POLYPECTOMY     Stapendectomy  1985, 1993   TONSILLECTOMY AND ADENOIDECTOMY     TRANSURETHRAL RESECTION OF BLADDER TUMOR N/A 04/21/2022   Procedure: TRANSURETHRAL RESECTION OF BLADDER TUMOR (TURBT);  Surgeon: Rosalind Zachary NOVAK, MD;  Location: [facility];  Service: Urology;  Laterality: N/A;  30 MINS   TUBAL LIGATION  1980   WISDOM TOOTH EXTRACTION      Prior to Admission medications   Medication  Sig Start Date End Date Taking? Authorizing Provider  albuterol  (VENTOLIN  HFA) 108 (90 Base) MCG/ACT inhaler Inhale 2 puffs into the lungs every 4 (four) hours. Patient taking differently: Inhale 2 puffs into the lungs every 4 (four) hours as needed.    [provider]  aspirin 81 MG tablet Take 81 mg by mouth daily.      [provider]  calcium  carbonate (OS-CAL) 600 MG TABS Take 600 mg by mouth 2 (two) times daily with a meal.   Patient taking differently: Take 600 mg by mouth daily.    [provider]  cholecalciferol (VITAMIN D) 1000 UNITS tablet Take 1,000 Units by mouth daily.      [provider]  estradiol (ESTRACE) 0.5 MG tablet Take 0.5 mg by mouth daily.      [provider]  Fluticasone-Salmeterol (ADVAIR DISKUS) 250-50 MCG/DOSE AEPB Inhale 1 puff into the lungs every 12 (twelve) hours.      [provider]  glycopyrrolate (ROBINUL) 1 MG tablet Take 1 mg by mouth 2 (two) times daily.      [provider]  hydrochlorothiazide (HYDRODIURIL) 25 MG tablet Take 25 mg by mouth daily. 01/05/24   [provider]  levothyroxine (SYNTHROID) 88 MCG tablet Take 88 mcg by mouth daily before breakfast.    [provider]  Magnesium 250 MG TABS Take by mouth daily.    [provider]  MULTIPLE VITAMIN PO Take by mouth.    [provider]  omeprazole (PRILOSEC) 40 MG capsule omeprazole 40 mg capsule,delayed release    [provider]  rosuvastatin  (CRESTOR ) 20 MG tablet TAKE 1 TABLET (20 MG TOTAL) BY MOUTH DAILY. WITH HEAVIEST MEAL. 11/25/23   Raford Riggs, MD  valACYclovir (VALTREX) 500 MG tablet Take 500 mg by mouth 3 (three) times daily as needed. 11/24/23   [provider]    Current Outpatient Medications  Medication Sig Dispense Refill   albuterol  (VENTOLIN  HFA) 108 (90 Base) MCG/ACT inhaler Inhale 2 puffs into the lungs every 4 (four) hours. (Patient taking differently:  Inhale 2 puffs into the lungs every 4 (four) hours as needed.)     aspirin 81 MG tablet Take 81 mg by mouth daily.       calcium  carbonate (OS-CAL) 600 MG TABS Take 600 mg by mouth 2 (two) times daily with a meal.   (Patient taking differently: Take 600 mg by mouth daily.)     cholecalciferol (VITAMIN D) 1000 UNITS tablet Take 1,000 Units by mouth daily.       estradiol (ESTRACE) 0.5 MG tablet Take 0.5 mg by mouth daily.       Fluticasone-Salmeterol (ADVAIR DISKUS) 250-50 MCG/DOSE AEPB Inhale 1 puff into the lungs every 12 (twelve) hours.       glycopyrrolate (ROBINUL) 1 MG tablet Take 1 mg by mouth 2 (two) times daily.       hydrochlorothiazide (HYDRODIURIL) 25 MG tablet Take 25 mg by mouth daily.     levothyroxine (SYNTHROID) 88 MCG tablet Take 88 mcg by mouth daily before breakfast.     Magnesium 250 MG TABS Take by mouth daily.     MULTIPLE VITAMIN PO Take by mouth.     omeprazole (PRILOSEC) 40 MG capsule omeprazole 40 mg capsule,delayed release     rosuvastatin  (CRESTOR ) 20 MG tablet TAKE 1 TABLET (20 MG TOTAL) BY MOUTH DAILY. WITH HEAVIEST MEAL. 90 tablet 2   valACYclovir (VALTREX) 500 MG tablet Take 500 mg by mouth 3 (three) times daily as needed.     No current facility-administered medications for this visit.    Allergies as of 01/31/2024 - Review Complete 01/19/2024  Allergen Reaction Noted   Tussionex pennkinetic er [hydrocod poli-chlorphe poli er] Other (See Comments) 06/10/2012   Lodine [etodolac] Swelling 02/02/2011   Sulfa antibiotics Other (See Comments) 10/06/2018   Codeine Nausea Only 06/10/2012   Nickel Dermatitis 03/18/2023   Penicillins Rash and Other (See Comments) 02/02/2011    Family History  Problem Relation Age of Onset   Colon polyps Mother    Melanoma Father    Colon polyps Father    Lung cancer Father    Brain cancer Father    Stomach cancer Maternal Grandfather    Irritable bowel syndrome Other    Colon cancer Neg Hx    Breast cancer Neg Hx     Esophageal cancer Neg Hx    Rectal cancer Neg Hx     Social History   Socioeconomic History   Marital status: Married    Spouse name: Not on file   Number of children: 1   Years of education: Not on file   Highest education level: Not on file  Occupational History   Occupation: Ship broker: [employer]    Employer: [employer]  Tobacco Use  Smoking status: Never   Smokeless tobacco: Never  Vaping Use   Vaping status: Never Used  Substance and Sexual Activity   Alcohol use: Yes    Comment: occasionally twice a month liquor   Drug use: No   Sexual activity: Yes    Birth control/protection: None  Other Topics Concern   Not on file  Social History Narrative   Not on file   Social Drivers of Health   Financial Resource Strain: Not on file  Food Insecurity: Not on file  Transportation Needs: Not on file  Physical Activity: Not on file  Stress: Not on file  Social Connections: Not on file  Intimate Partner Violence: Not on file    Review of Systems:  All other review of systems negative except as mentioned in the HPI.  Physical Exam: Vital signs There were no vitals taken for this visit.  General:   Alert,  Well-developed, well-nourished, pleasant and cooperative in NAD Airway:  Mallampati  Lungs:  Clear throughout to auscultation.   Heart:  Regular rate and rhythm; no murmurs, clicks, rubs,  or gallops. Abdomen:  Soft, nontender and nondistended. Normal bowel sounds.   Neuro/Psych:  Normal mood and affect. A and O x 3  Inocente Hausen, MD [facility]

## 2024-01-31 ENCOUNTER — Ambulatory Visit: Admitting: Pediatrics

## 2024-01-31 ENCOUNTER — Encounter: Payer: Self-pay | Admitting: Pediatrics

## 2024-01-31 VITALS — BP 122/62 | HR 65 | Temp 96.6°F | Resp 14 | Ht 64.0 in | Wt 107.0 lb

## 2024-01-31 DIAGNOSIS — K648 Other hemorrhoids: Secondary | ICD-10-CM | POA: Diagnosis not present

## 2024-01-31 DIAGNOSIS — Z860101 Personal history of adenomatous and serrated colon polyps: Secondary | ICD-10-CM

## 2024-01-31 DIAGNOSIS — Z860100 Personal history of colon polyps, unspecified: Secondary | ICD-10-CM

## 2024-01-31 DIAGNOSIS — K5730 Diverticulosis of large intestine without perforation or abscess without bleeding: Secondary | ICD-10-CM

## 2024-01-31 DIAGNOSIS — Z1211 Encounter for screening for malignant neoplasm of colon: Secondary | ICD-10-CM

## 2024-01-31 DIAGNOSIS — D125 Benign neoplasm of sigmoid colon: Secondary | ICD-10-CM | POA: Diagnosis not present

## 2024-01-31 DIAGNOSIS — D122 Benign neoplasm of ascending colon: Secondary | ICD-10-CM

## 2024-01-31 MED ORDER — SODIUM CHLORIDE 0.9 % IV SOLN: 500.0000 mL | INTRAVENOUS | Status: DC

## 2024-01-31 NOTE — Progress Notes (Signed)
 Sedate, gd SR, tolerated procedure well, VSS, report to RN

## 2024-01-31 NOTE — Op Note (Signed)
 [facility] Patient Name: Alanda Colton Procedure Date: 01/31/2024 7:11 AM MRN: [id number] Endoscopist: Inocente Hausen , MD, [id number] Age: 73 Referring MD:  Date of Birth: July 11, 1950 Gender: Female Account #: [id number] Procedure:                Colonoscopy Indications:              High risk colon cancer surveillance: Personal                            history of adenoma (10 mm or greater in size), High                            risk colon cancer surveillance: Personal history of                            multiple (3 or more) adenomas, Last colonoscopy:                            June 2020 Medicines:                Monitored Anesthesia Care Procedure:                Pre-Anesthesia Assessment:                           - Prior to the procedure, a History and Physical                            was performed, and patient medications and                            allergies were reviewed. The patient's tolerance of                            previous anesthesia was also reviewed. The risks                            and benefits of the procedure and the sedation                            options and risks were discussed with the patient.                            All questions were answered, and informed consent                            was obtained. Prior Anticoagulants: The patient has                            taken no anticoagulant or antiplatelet agents. ASA                            Grade Assessment: II - A patient with mild systemic  disease. After reviewing the risks and benefits,                            the patient was deemed in satisfactory condition to                            undergo the procedure.                           After obtaining informed consent, the colonoscope                            was passed under direct vision. Throughout the                            procedure, the patient's blood pressure, pulse, and                             oxygen saturations were monitored continuously. The                            Olympus Scope SN [id number] was introduced through the                            anus and advanced to the cecum, identified by                            appendiceal orifice and ileocecal valve. The                            colonoscopy was performed without difficulty. The                            patient tolerated the procedure well. The quality                            of the bowel preparation was adequate to identify                            polyps greater than 5 mm in size. The ileocecal                            valve, appendiceal orifice, and rectum were                            photographed. Scope In: 8:01:24 AM Scope Out: 8:21:45 AM Scope Withdrawal Time: 0 hours 14 minutes 24 seconds  Total Procedure Duration: 0 hours 20 minutes 21 seconds  Findings:                 The perianal and digital rectal examinations were                            normal. Pertinent negatives include normal  sphincter tone and no palpable rectal lesions.                           A moderate amount of semi-liquid stool was found in                            the entire colon. Lavage of the area was performed                            using a large amount of sterile water , resulting in                            clearance with adequate visualization.                           A 5 mm polyp was found in the sigmoid colon. The                            polyp was sessile. The polyp was removed with a                            cold snare. Resection and retrieval were complete.                           Multiple medium-mouthed and small-mouthed                            diverticula were found in the sigmoid colon.                           Internal hemorrhoids were found during retroflexion. Complications:            No immediate complications. Estimated blood loss:                             Minimal. Estimated Blood Loss:     Estimated blood loss was minimal. Impression:               - Stool in the entire examined colon.                           - One 5 mm polyp in the sigmoid colon, removed with                            a cold snare. Resected and retrieved.                           - Diverticulosis in the sigmoid colon.                           - Internal hemorrhoids. Recommendation:           - Discharge patient to home (ambulatory).                           -  Await pathology results.                           - Repeat colonoscopy for surveillance based on                            pathology results. Consider repeat colonoscopy in 5                            years given presence of stool with need for lavage                            and prior history of adenomatous polyps.                           - The findings and recommendations were discussed                            with the patient's family.                           - Return to referring physician.                           - Patient has a contact number available for                            emergencies. The signs and symptoms of potential                            delayed complications were discussed with the                            patient. Return to normal activities tomorrow.                            Written discharge instructions were provided to the                            patient. Inocente Hausen, MD 01/31/2024 8:28:11 AM This report has been signed electronically.

## 2024-01-31 NOTE — Progress Notes (Signed)
 Pt's states no medical or surgical changes since previsit or office visit.

## 2024-01-31 NOTE — Progress Notes (Signed)
 Called to room to assist during endoscopic procedure.  Patient ID and intended procedure confirmed with present staff. Received instructions for my participation in the procedure from the performing physician.

## 2024-01-31 NOTE — Patient Instructions (Addendum)
-   1 polyp removed and sent to pathology. Diverticulosis and internal hemorrhoids.  - Await pathology results. - Repeat colonoscopy for surveillance based on    pathology results. Consider repeat colonoscopy in 5    years given presence of stool with need for lavage    and prior history of adenomatous polyps. - Return to referring physician.  YOU HAD AN ENDOSCOPIC PROCEDURE TODAY AT THE [facility]:   Refer to the procedure report that was given to you for any specific questions about what was found during the examination.  If the procedure report does not answer your questions, please call your gastroenterologist to clarify.  If you requested that your care partner not be given the details of your procedure findings, then the procedure report has been included in a sealed envelope for you to review at your convenience later.  YOU SHOULD EXPECT: Some feelings of bloating in the abdomen. Passage of more gas than usual.  Walking can help get rid of the air that was put into your GI tract during the procedure and reduce the bloating. If you had a lower endoscopy (such as a colonoscopy or flexible sigmoidoscopy) you may notice spotting of blood in your stool or on the toilet paper. If you underwent a bowel prep for your procedure, you may not have a normal bowel movement for a few days.  Please Note:  You might notice some irritation and congestion in your nose or some drainage.  This is from the oxygen used during your procedure.  There is no need for concern and it should clear up in a day or so.  SYMPTOMS TO REPORT IMMEDIATELY:  Following lower endoscopy (colonoscopy or flexible sigmoidoscopy):  Excessive amounts of blood in the stool  Significant tenderness or worsening of abdominal pains  Swelling of the abdomen that is new, acute  Fever of 100F or higher   For urgent or emergent issues, a gastroenterologist can be reached at any hour by calling [phone]. Do not use  MyChart messaging for urgent concerns.    DIET:  We do recommend a small meal at first, but then you may proceed to your regular diet.  Drink plenty of fluids but you should avoid alcoholic beverages for 24 hours.  ACTIVITY:  You should plan to take it easy for the rest of today and you should NOT DRIVE or use heavy machinery until tomorrow (because of the sedation medicines used during the test).    FOLLOW UP: Our staff will call the number listed on your records the next business day following your procedure.  We will call around 7:15- 8:00 am to check on you and address any questions or concerns that you may have regarding the information given to you following your procedure. If we do not reach you, we will leave a message.     If any biopsies were taken you will be contacted by phone or by letter within the next 1-3 weeks.  Please call us  at [phone] if you have not heard about the biopsies in 3 weeks.    SIGNATURES/CONFIDENTIALITY: You and/or your care partner have signed paperwork which will be entered into your electronic medical record.  These signatures attest to the fact that that the information above on your After Visit Summary has been reviewed and is understood.  Full responsibility of the confidentiality of this discharge information lies with you and/or your care-partner.

## 2024-02-01 ENCOUNTER — Telehealth: Payer: Self-pay

## 2024-02-01 NOTE — Telephone Encounter (Signed)
  Follow up Call-     01/31/2024    7:16 AM  Call back number  Post procedure Call Back phone  # [phone]  Permission to leave phone message Yes     Patient questions:  Do you have a fever, pain , or abdominal swelling? No. Pain Score  0 *  Have you tolerated food without any problems? Yes.    Have you been able to return to your normal activities? Yes.    Do you have any questions about your discharge instructions: Diet   No. Medications  No. Follow up visit  No.  Do you have questions or concerns about your Care? No.  Actions: * If pain score is 4 or above: No action needed, pain <4.

## 2024-02-02 ENCOUNTER — Ambulatory Visit: Payer: Self-pay | Admitting: Pediatrics

## 2024-02-02 LAB — SURGICAL PATHOLOGY

## 2024-02-08 ENCOUNTER — Other Ambulatory Visit: Payer: Self-pay | Admitting: Internal Medicine

## 2024-02-08 DIAGNOSIS — Z1231 Encounter for screening mammogram for malignant neoplasm of breast: Secondary | ICD-10-CM

## 2024-02-23 ENCOUNTER — Encounter

## 2024-02-23 DIAGNOSIS — Z1231 Encounter for screening mammogram for malignant neoplasm of breast: Secondary | ICD-10-CM

## 2024-03-06 ENCOUNTER — Ambulatory Visit: Admitting: Dermatology

## 2024-03-06 ENCOUNTER — Encounter: Payer: Self-pay | Admitting: Dermatology

## 2024-03-06 VITALS — BP 149/69 | HR 80

## 2024-03-06 DIAGNOSIS — Z1283 Encounter for screening for malignant neoplasm of skin: Secondary | ICD-10-CM | POA: Diagnosis not present

## 2024-03-06 DIAGNOSIS — L578 Other skin changes due to chronic exposure to nonionizing radiation: Secondary | ICD-10-CM | POA: Diagnosis not present

## 2024-03-06 DIAGNOSIS — L720 Epidermal cyst: Secondary | ICD-10-CM | POA: Diagnosis not present

## 2024-03-06 DIAGNOSIS — L821 Other seborrheic keratosis: Secondary | ICD-10-CM

## 2024-03-06 DIAGNOSIS — L570 Actinic keratosis: Secondary | ICD-10-CM

## 2024-03-06 DIAGNOSIS — D489 Neoplasm of uncertain behavior, unspecified: Secondary | ICD-10-CM

## 2024-03-06 DIAGNOSIS — D229 Melanocytic nevi, unspecified: Secondary | ICD-10-CM

## 2024-03-06 DIAGNOSIS — L814 Other melanin hyperpigmentation: Secondary | ICD-10-CM

## 2024-03-06 DIAGNOSIS — L820 Inflamed seborrheic keratosis: Secondary | ICD-10-CM | POA: Diagnosis not present

## 2024-03-06 DIAGNOSIS — W908XXA Exposure to other nonionizing radiation, initial encounter: Secondary | ICD-10-CM | POA: Diagnosis not present

## 2024-03-06 DIAGNOSIS — D1801 Hemangioma of skin and subcutaneous tissue: Secondary | ICD-10-CM

## 2024-03-06 DIAGNOSIS — B351 Tinea unguium: Secondary | ICD-10-CM

## 2024-03-06 MED ORDER — CICLOPIROX 8 % EX SOLN: Freq: Every day | CUTANEOUS | 0 refills | Status: AC

## 2024-03-06 NOTE — Patient Instructions (Addendum)

## 2024-03-06 NOTE — Progress Notes (Unsigned)
 Total Body Skin Exam (TBSE) Visit   Subjective  Robin Mcdonald is a 73 y.o. female who presents for the following: Skin Cancer Screening and Full Body Skin Exam  Patient presents today for follow up visit for TBSE. Patient was last evaluated on 12/08/2023 .  Patient denies medication changes.   Patient reports she does have spots, moles and lesions of concern to be evaluated.  Spot on back that patient would like to discuss. Patient reports that it has been painful the past couple of months. Toe nail on Left Great Toe has discoloration. Sore that comes and goes just inside of both nostrils  Patient reports throughout her lifetime she has had moderate sun exposure.  Currently, patient reports if she has excessive sun exposure, she does apply sunscreen and/or wears protective coverings.   Patient reports she does not have hx of bx with hx of SCC (Jan 2024 Right Arm & April 2024 Left Lower Leg) Patient denies  family history of skin cancers.   The patient has spots, moles and lesions to be evaluated, some may be new or changing and the patient has concerns that these could be cancer.  Patient would also like to discuss hair loss  The following portions of the chart were reviewed this encounter and updated as appropriate: medications, allergies, medical history  Review of Systems:  No other skin or systemic complaints except as noted in HPI or Assessment and Plan.  Objective  Well appearing patient in no apparent distress; mood and affect are within normal limits.  A full examination was performed including scalp, head, eyes, ears, nose, lips, neck, chest, axillae, abdomen, back, buttocks, bilateral upper extremities, bilateral lower extremities, hands, feet, fingers, toes, fingernails, and toenails. All findings within normal limits unless otherwise noted below.   Relevant physical exam findings are noted in the Assessment and Plan.  Left Upper Chest 5mm scaly papule  Left Upper  Back 2mm pustule  Right Lower Leg - Anterior 5mm Irregular brown macule   Assessment & Plan   LENTIGINES, SEBORRHEIC KERATOSES, HEMANGIOMAS - Benign normal skin lesions - Benign-appearing - Call for any changes  MELANOCYTIC NEVI - Tan-brown and/or pink-flesh-colored symmetric macules and papules - Benign appearing on exam today - Observation - Call clinic for new or changing moles - Recommend daily use of broad spectrum spf 30+ sunscreen to sun-exposed areas.   ACTINIC DAMAGE - Chronic condition, secondary to cumulative UV/sun exposure - diffuse scaly erythematous macules with underlying dyspigmentation - Recommend daily broad spectrum sunscreen SPF 30+ to sun-exposed areas, reapply every 2 hours as needed.  - Staying in the shade or wearing long sleeves, sun glasses (UVA+UVB protection) and wide brim hats (4-inch brim around the entire circumference of the hat) are also recommended for sun protection.  - Call for new or changing lesions.  SKIN CANCER SCREENING PERFORMED TODAY.  A - Left upper chest 5mm scaley papule r/o scc  B - r/o infammed cust vs neoplamsm 4mm punch  ONYCHOMYCOSIS Exam: Thickened toenails with subungal debris c/w onychomycosis  Treatment Plan: Prescribed Penlac 8% solution to use on affected area of Left Great Toe  NEOPLASM OF UNCERTAIN BEHAVIOR (3) Left Upper Chest Epidermal / dermal shaving  Lesion diameter (cm):  0.5 Informed consent: discussed and consent obtained   Timeout: patient name, date of birth, surgical site, and procedure verified   Procedure prep:  Patient was prepped and draped in usual sterile fashion Prep type:  Isopropyl alcohol Instrument used: DermaBlade  Hemostasis achieved with: aluminum chloride   Outcome: patient tolerated procedure well   Post-procedure details: wound care instructions given    Left Upper Back Skin / nail biopsy Type of biopsy: punch   Timeout: patient name, date of birth, surgical site, and  procedure verified   Procedure prep:  Patient was prepped and draped in usual sterile fashion Prep type:  Isopropyl alcohol Anesthesia: the lesion was anesthetized in a standard fashion   Anesthetic:  1% lidocaine  w/ epinephrine  1-100,000 local infiltration Punch size:  4 mm Suture size:  4-0 Suture type: Prolene (polypropylene)   Hemostasis achieved with: suture and pressure   Outcome: patient tolerated procedure well   Post-procedure details: sterile dressing applied and wound care instructions given   Dressing type: bandage    Right Lower Leg - Anterior Epidermal / dermal shaving  Lesion diameter (cm):  0.5 Informed consent: discussed and consent obtained   Timeout: patient name, date of birth, surgical site, and procedure verified   Procedure prep:  Patient was prepped and draped in usual sterile fashion Prep type:  Isopropyl alcohol Instrument used: DermaBlade   Outcome: patient tolerated procedure well   Post-procedure details: sterile dressing applied   Dressing type: bandage    ONYCHOMYCOSIS   Related Medications ciclopirox (PENLAC) 8 % solution Apply topically at bedtime. Apply over nail and surrounding skin. Apply daily over previous coat. After seven (7) days, may remove with alcohol and continue cycle. AK (ACTINIC KERATOSIS) (9) Left Breast, Left Forearm - Posterior (4), Right Forearm - Posterior (2), Right Hand - Posterior, Right Thigh - Anterior Destruction of lesion - Left Breast, Left Forearm - Posterior (4), Right Forearm - Posterior (2), Right Hand - Posterior, Right Thigh - Anterior  Destruction method: cryotherapy   Timeout:  patient name, date of birth, surgical site, and procedure verified Procedure prep:  Patient was prepped and draped in usual sterile fashion Prep type:  Isopropyl alcohol Cryotherapy cycles:  10 Outcome: patient tolerated procedure well with no complications   Post-procedure details: wound care instructions given    Return in about  2 weeks (around 03/20/2024) for suture removal.  I, Lyle Cords, as acting as a neurosurgeon for [employer], DO .   Documentation: I have reviewed the above documentation for accuracy and completeness, and I agree with the above.  Delon Lenis, DO

## 2024-03-08 LAB — SURGICAL PATHOLOGY

## 2024-03-09 ENCOUNTER — Ambulatory Visit: Payer: Self-pay | Admitting: Dermatology

## 2024-03-15 ENCOUNTER — Ambulatory Visit
Admission: RE | Admit: 2024-03-15 | Discharge: 2024-03-15 | Disposition: A | Discharge status: Home / Self Care | Source: Ambulatory Visit | Attending: Internal Medicine | Admitting: Internal Medicine

## 2024-03-15 DIAGNOSIS — Z1231 Encounter for screening mammogram for malignant neoplasm of breast: Secondary | ICD-10-CM

## 2024-03-20 ENCOUNTER — Ambulatory Visit: Admitting: Dermatology

## 2024-03-20 ENCOUNTER — Encounter: Payer: Self-pay | Admitting: Dermatology

## 2024-03-20 VITALS — BP 140/67 | HR 81

## 2024-03-20 DIAGNOSIS — L299 Pruritus, unspecified: Secondary | ICD-10-CM

## 2024-03-20 DIAGNOSIS — Z79890 Hormone replacement therapy (postmenopausal): Secondary | ICD-10-CM | POA: Insufficient documentation

## 2024-03-20 DIAGNOSIS — E559 Vitamin D deficiency, unspecified: Secondary | ICD-10-CM | POA: Insufficient documentation

## 2024-03-20 DIAGNOSIS — I700 Atherosclerosis of aorta: Secondary | ICD-10-CM | POA: Insufficient documentation

## 2024-03-20 DIAGNOSIS — Z48817 Encounter for surgical aftercare following surgery on the skin and subcutaneous tissue: Secondary | ICD-10-CM

## 2024-03-20 DIAGNOSIS — I10 Essential (primary) hypertension: Secondary | ICD-10-CM | POA: Insufficient documentation

## 2024-03-20 DIAGNOSIS — L853 Xerosis cutis: Secondary | ICD-10-CM | POA: Diagnosis not present

## 2024-03-20 DIAGNOSIS — E782 Mixed hyperlipidemia: Secondary | ICD-10-CM | POA: Insufficient documentation

## 2024-03-20 DIAGNOSIS — M5090 Cervical disc disorder, unspecified, unspecified cervical region: Secondary | ICD-10-CM | POA: Insufficient documentation

## 2024-03-20 DIAGNOSIS — K219 Gastro-esophageal reflux disease without esophagitis: Secondary | ICD-10-CM | POA: Insufficient documentation

## 2024-03-20 DIAGNOSIS — E039 Hypothyroidism, unspecified: Secondary | ICD-10-CM | POA: Insufficient documentation

## 2024-03-20 DIAGNOSIS — K5730 Diverticulosis of large intestine without perforation or abscess without bleeding: Secondary | ICD-10-CM | POA: Insufficient documentation

## 2024-03-20 DIAGNOSIS — R310 Gross hematuria: Secondary | ICD-10-CM | POA: Insufficient documentation

## 2024-03-20 DIAGNOSIS — G43109 Migraine with aura, not intractable, without status migrainosus: Secondary | ICD-10-CM | POA: Insufficient documentation

## 2024-03-20 DIAGNOSIS — J45909 Unspecified asthma, uncomplicated: Secondary | ICD-10-CM | POA: Insufficient documentation

## 2024-03-20 NOTE — Patient Instructions (Signed)

## 2024-03-20 NOTE — Progress Notes (Signed)
   Follow-Up Visit   Subjective  Robin Mcdonald is a 73 y.o. female who presents for the following: Suture Removal  Patient was last evaluated on 03/06/24.  Patient denies medication changes.  Patient states bx spot on back had no issues Patient would like to discuss bx spot on leg  The following portions of the chart were reviewed this encounter and updated as appropriate: medications, allergies, medical history  Review of Systems:  No other skin or systemic complaints except as noted in HPI or Assessment and Plan.  Objective  A focused examination was performed of the following areas: Back  Relevant exam findings are noted in the Assessment and Plan.        Assessment & Plan   Post-procedure wound healing on the chest and leg. The chest wound is healing well with robust blood flow. The leg wound is healing slower, which is typical due to less robust blood flow. Normal erythema is present, with no signs of infection such as increased redness, pain, or pus drainage.   -Sutures removed from punch bx on back -Reviewed pathology ( 2 Sk's and 1 Cyst) - Apply Aquaphor and a Band-Aid during the day. - At night, apply a large amount of Aquaphor and massage for two minutes to stimulate blood flow. - Elevate the leg to reduce swelling. - Limit salt intake to prevent swelling.  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 The back is dry, contributing to itching. - Use Aquaphor spray for moisturizing the back after showering.     Return if symptoms worsen or fail to improve.  I, Lyle Cords, as acting as a neurosurgeon for [employer], DO .   Documentation: I have reviewed the above documentation for accuracy and completeness, and I agree with the above.  Delon Lenis, DO

## 2024-09-13 ENCOUNTER — Ambulatory Visit: Admitting: Dermatology
